# Patient Record
Sex: Female | Born: 1985 | Race: Asian | Hispanic: No | Marital: Married | Smoking: Never smoker
Health system: Southern US, Community
[De-identification: ages and names within clinical notes are randomized; demographics above are authoritative.]

## PROBLEM LIST (undated history)

## (undated) DIAGNOSIS — Z87442 Personal history of urinary calculi: Secondary | ICD-10-CM

## (undated) DIAGNOSIS — D219 Benign neoplasm of connective and other soft tissue, unspecified: Secondary | ICD-10-CM

## (undated) DIAGNOSIS — O139 Gestational [pregnancy-induced] hypertension without significant proteinuria, unspecified trimester: Secondary | ICD-10-CM

## (undated) DIAGNOSIS — H18603 Keratoconus, unspecified, bilateral: Secondary | ICD-10-CM

## (undated) DIAGNOSIS — F419 Anxiety disorder, unspecified: Secondary | ICD-10-CM

## (undated) DIAGNOSIS — O1490 Unspecified pre-eclampsia, unspecified trimester: Secondary | ICD-10-CM

## (undated) DIAGNOSIS — E039 Hypothyroidism, unspecified: Secondary | ICD-10-CM

## (undated) DIAGNOSIS — K429 Umbilical hernia without obstruction or gangrene: Secondary | ICD-10-CM

## (undated) HISTORY — PX: OTHER SURGICAL HISTORY: SHX169

---

## 2010-06-21 HISTORY — PX: THYROIDECTOMY, PARTIAL: SHX18

## 2017-06-21 HISTORY — PX: EYE SURGERY: SHX253

## 2017-07-26 DIAGNOSIS — H52203 Unspecified astigmatism, bilateral: Secondary | ICD-10-CM | POA: Insufficient documentation

## 2017-07-26 DIAGNOSIS — H1013 Acute atopic conjunctivitis, bilateral: Secondary | ICD-10-CM | POA: Insufficient documentation

## 2017-07-26 DIAGNOSIS — H18623 Keratoconus, unstable, bilateral: Secondary | ICD-10-CM | POA: Insufficient documentation

## 2017-07-26 DIAGNOSIS — H5213 Myopia, bilateral: Secondary | ICD-10-CM | POA: Insufficient documentation

## 2017-10-13 DIAGNOSIS — K580 Irritable bowel syndrome with diarrhea: Secondary | ICD-10-CM | POA: Insufficient documentation

## 2017-10-13 DIAGNOSIS — E063 Autoimmune thyroiditis: Secondary | ICD-10-CM | POA: Insufficient documentation

## 2019-08-03 ENCOUNTER — Encounter: Payer: Self-pay | Admitting: Podiatry

## 2019-08-03 ENCOUNTER — Ambulatory Visit: Payer: 59 | Admitting: Podiatry

## 2019-08-03 ENCOUNTER — Other Ambulatory Visit: Payer: Self-pay

## 2019-08-03 VITALS — Temp 96.9°F

## 2019-08-03 DIAGNOSIS — B070 Plantar wart: Secondary | ICD-10-CM | POA: Diagnosis not present

## 2019-08-03 NOTE — Progress Notes (Signed)
Subjective:   Patient ID: Amber Kelley, female   DOB: 34 y.o.   MRN: [id number]   HPI Patient presents stating she is having a lot of pain underneath her right foot and states it is been bothering her more over time.  Patient states that it was treated about 8 years ago and seemed to go away for.  And has come back and patient does not smoke likes to be active   Review of Systems  All other systems reviewed and are negative.       Objective:  Physical Exam Vitals and nursing note reviewed.  Constitutional:      Appearance: She is well-developed.  Pulmonary:     Effort: Pulmonary effort is normal.  Musculoskeletal:        General: Normal range of motion.  Skin:    General: Skin is warm.  Neurological:     Mental Status: She is alert.     Neurovascular status intact muscle strength found to be adequate range of motion within normal limits with patient noted to have keratotic lesion subsecond metatarsal right that shows pinpoint bleeding upon debridement and also does have some waxy core appearance to it.  Patient has good digital perfusion well oriented x3     Assessment:  Probability for keratotic lesion which may be verruca plantaris or possible porokeratotic type lesion      Plan:  H&P condition reviewed today I did sharp sterile debridement of the area and I applied chemical agent to create immune response with sterile dressing and advised on home therapy.  As symptoms come back she will let us know and hopefully this will take care of it for significant period of time

## 2019-08-03 NOTE — Progress Notes (Signed)
   Subjective:    Patient ID: Amber Kelley, female    DOB: 09-Jan-1986, 34 y.o.   MRN: [id number]  HPI    Review of Systems  All other systems reviewed and are negative.      Objective:   Physical Exam        Assessment & Plan:

## 2019-11-06 ENCOUNTER — Other Ambulatory Visit: Payer: Self-pay | Admitting: Internal Medicine

## 2019-11-06 DIAGNOSIS — E890 Postprocedural hypothyroidism: Secondary | ICD-10-CM

## 2019-11-06 DIAGNOSIS — E063 Autoimmune thyroiditis: Secondary | ICD-10-CM

## 2019-11-06 DIAGNOSIS — Z8639 Personal history of other endocrine, nutritional and metabolic disease: Secondary | ICD-10-CM

## 2019-11-15 ENCOUNTER — Ambulatory Visit
Admission: RE | Admit: 2019-11-15 | Discharge: 2019-11-15 | Disposition: A | Discharge status: Home / Self Care | Payer: 59 | Source: Ambulatory Visit | Attending: Internal Medicine | Admitting: Internal Medicine

## 2019-11-15 DIAGNOSIS — Z8639 Personal history of other endocrine, nutritional and metabolic disease: Secondary | ICD-10-CM

## 2019-11-15 DIAGNOSIS — E890 Postprocedural hypothyroidism: Secondary | ICD-10-CM

## 2019-11-15 DIAGNOSIS — E063 Autoimmune thyroiditis: Secondary | ICD-10-CM

## 2020-01-17 ENCOUNTER — Other Ambulatory Visit: Payer: Self-pay | Admitting: Family Medicine

## 2020-01-17 DIAGNOSIS — R1909 Other intra-abdominal and pelvic swelling, mass and lump: Secondary | ICD-10-CM

## 2020-01-18 ENCOUNTER — Ambulatory Visit
Admission: RE | Admit: 2020-01-18 | Discharge: 2020-01-18 | Disposition: A | Discharge status: Home / Self Care | Payer: 59 | Source: Ambulatory Visit | Attending: Family Medicine | Admitting: Family Medicine

## 2020-01-18 ENCOUNTER — Other Ambulatory Visit: Payer: Self-pay

## 2020-01-18 DIAGNOSIS — R1909 Other intra-abdominal and pelvic swelling, mass and lump: Secondary | ICD-10-CM

## 2020-01-18 MED ORDER — IOPAMIDOL (ISOVUE-300) INJECTION 61%
100.0000 mL | Freq: Once | INTRAVENOUS | Status: AC | PRN
  Administered 2020-01-18: 100 mL via INTRAVENOUS

## 2020-01-21 ENCOUNTER — Other Ambulatory Visit: Payer: 59

## 2020-04-24 NOTE — H&P (Signed)
Amber Kelley is an 34 y.o. G1P0010 who is admitted for abdominal myomectomy for enlarged, symptomatic fibroid uterus.  Her periods are monthly, normal in flow, and not heavy.  She has symptoms of pelvic pressure and abdominal bloating.  She has three adopted children.  Has a long standing history of infertility and has accepted the reality that she may not ever have biologic children.  She is, however, experiencing distress regarding her abdominal distention and appearing pregnant when she is not.  Work-up: CT A/P (01/18/20): Markedly enlarged uterus 16.7 x 8.3 x 12.6cm containing multiple masses consistent with uterine leiomyomata.  Largest of these measure 6.9cm lower left uterus and 11.0cm superior left uterus.  Unremarkable ovaries. Abdominal US (03/04/20):  Uterus ~21.05 x 8.82 x 11.68cm.   Fibroid 1:  11cm  Fibroid 2 4.55cm  Fibroid 3:  6.08cm  Fibroid 4:  6.41cm  Fibroid 5:  6.93cm.   Anteverted uterus with multiple uterine fibroids - fundal 11 x 8.8 x 7.2cm, anterior 4.6 x 4.5 x 3.8cm, fundal right 6.1 x 6.0 x 5.6cm, posterior 6.4 x 6.1 x 5.2cm, posterior left 6.9 x 6.6 x 6.6cm.   Other smaller fibroids seen throughout uterus.  Uterus measurement approximate - difficult to clearly measure uterus in its entirety due to size.  Endometrium thickened and distorted due to fibroids.  R ovary with simple cyst 3.2 x 3.1 x 2.7cm, avascular.  Left ovary not visualized.  No adnexal masses seen.  Patient Active Problem List   Diagnosis Date Noted  . Fibroid uterus 05/02/2020  . Hypothyroidism due to Hashimoto's thyroiditis 10/13/2017  . Irritable bowel syndrome with diarrhea 10/13/2017  . Allergic conjunctivitis of both eyes 07/26/2017  . Myopia of both eyes with astigmatism 07/26/2017  . 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    Past Medical History:  Diagnosis Date  . History of kidney stones    per patient was told has one kidney stone  . Hypothyroidism   . Umbilical hernia    per patient, was told has a small umbilical hernia     Past Surgical History:  Procedure Laterality Date  . EYE SURGERY Bilateral 2019   per patient "CORNEAL CROSS-LINKING"    No family history on file.  Social History:  reports that she has never smoked. She has never used smokeless tobacco. She reports current alcohol use. No history on file for drug use.  ALLERGIES/MEDS:  Allergies: No Known Allergies  No medications prior to admission.     Review of Systems  Constitutional: Negative.   HENT: Negative.   Eyes: Negative.   Respiratory: Negative.   Cardiovascular: Negative.   Gastrointestinal: Negative.   Genitourinary: Negative.   Musculoskeletal: Negative.   Skin: Negative.   Neurological: Negative.  Negative for speech change.  Endo/Heme/Allergies: Negative.   Psychiatric/Behavioral: Negative.    Physical Exam: Last menstrual period 04/14/2020. Gen:  NAD Cardio:  RRR Pulm:  CTAB, no wheezes/rales/rhonchi Abd:  Soft, distended, uterus palpable 3-5TI above umbilicus, non-tender throughout, no rebound/guarding Ext:  No bilateral LE edema  No results found for this or any previous visit (from the past 24 hour(s)).  No results found.  ASSESSMENT/PLAN: Amber Kelley is a 34 y.o. G1P0010 who is admitted for abdominal myomectomy for enlarged, symptomatic fibroid uterus.  - Admit to [facility] labs (CBC, T&S, COVID screen) - Diet:  NPO - IVF:  Per anethesia - VTE Prophylaxis:  SCDs - Antibiotics:  Ancef  2g on call to OR - Anticipate discharge home POD#1-2  Consents: I have explained to the patient that this surgery is performed to remove fibroids from the uterus through an incision in the abdomen.  I discussed the risks and benefits of the surgery, including, but not limited to bleeding, including the need for a blood transfusion and/or hysterectomy, need for cesarean section for all future deliveries, rupture of the uterus during a future pregnancy that could cause a preterm delivery and/or requiring hysterectomy, recurrence of fibroids, infection, damage to surrounding organs and tissues, damage to bladder, damage to ureters, causing kidney damage, and requiring additional procedures, damage to bowels, resulting in further surgery, postoperative pain, short-term and long-term, scarring on the abdominal wall and intra-abdominally, need for further surgery, development of an incisional hernia, deep vein thrombosis and/or pulmonary embolism, wound infection and/or separation, painful intercourse, urinary leakage, ovarian failure, resulting in menopausal symptoms requiring treatment, fistula formation, complications the course of which cannot be predicted or prevented, and death. Patient was consented for blood products.  The patient is aware that bleeding may result in the need for a blood transfusion which includes risk of transmission of HIV (1:2 million), Hepatitis C (1:2 million), and Hepatitis B (1:200 thousand) and transfusion reaction.  Patient voiced understanding of the above risks as well as understanding of indications for blood transfusion.   Drema Dallas, DO [phone] (office)

## 2020-05-01 ENCOUNTER — Encounter (HOSPITAL_COMMUNITY)
Admission: RE | Admit: 2020-05-01 | Discharge: 2020-05-01 | Disposition: A | Discharge status: Home / Self Care | Payer: 59 | Source: Ambulatory Visit | Attending: Obstetrics and Gynecology | Admitting: Obstetrics and Gynecology

## 2020-05-01 ENCOUNTER — Encounter (HOSPITAL_COMMUNITY): Payer: Self-pay

## 2020-05-01 ENCOUNTER — Other Ambulatory Visit: Payer: Self-pay

## 2020-05-01 DIAGNOSIS — Z01812 Encounter for preprocedural laboratory examination: Secondary | ICD-10-CM | POA: Diagnosis present

## 2020-05-01 HISTORY — DX: Hypothyroidism, unspecified: E03.9

## 2020-05-01 HISTORY — DX: Personal history of urinary calculi: Z87.442

## 2020-05-01 HISTORY — DX: Umbilical hernia without obstruction or gangrene: K42.9

## 2020-05-01 LAB — CBC WITH DIFFERENTIAL/PLATELET
Abs Immature Granulocytes: 0.03 10*3/uL (ref 0.00–0.07)
Basophils Absolute: 0 10*3/uL (ref 0.0–0.1)
Basophils Relative: 1 %
Eosinophils Absolute: 0.1 10*3/uL (ref 0.0–0.5)
Eosinophils Relative: 2 %
HCT: 40 % (ref 36.0–46.0)
Hemoglobin: 12.5 g/dL (ref 12.0–15.0)
Immature Granulocytes: 1 %
Lymphocytes Relative: 27 %
Lymphs Abs: 1.8 10*3/uL (ref 0.7–4.0)
MCH: 25.9 pg — ABNORMAL LOW (ref 26.0–34.0)
MCHC: 31.3 g/dL (ref 30.0–36.0)
MCV: 83 fL (ref 80.0–100.0)
Monocytes Absolute: 0.3 10*3/uL (ref 0.1–1.0)
Monocytes Relative: 5 %
Neutro Abs: 4.2 10*3/uL (ref 1.7–7.7)
Neutrophils Relative %: 64 %
Platelets: 261 10*3/uL (ref 150–400)
RBC: 4.82 MIL/uL (ref 3.87–5.11)
RDW: 13.2 % (ref 11.5–15.5)
WBC: 6.5 10*3/uL (ref 4.0–10.5)
nRBC: 0 % (ref 0.0–0.2)

## 2020-05-01 LAB — TYPE AND SCREEN
ABO/RH(D): O POS
Antibody Screen: NEGATIVE

## 2020-05-01 NOTE — Progress Notes (Signed)
Your procedure is scheduled on Wednesday, November 17th.  Report to [facility] Main Entrance "A" at 6:30 A.M., and check in at the Admitting office.  Call this number if you have problems the morning of surgery:  [phone]  Call [phone] if you have any questions prior to your surgery date Monday-Friday 8am-4pm   Remember:  Do not eat after midnight the night before your surgery  You may drink clear liquids until 5:30 A.M. the morning of your surgery.   Clear liquids allowed are: Water, Non-Citrus Juices (without pulp), Carbonated Beverages, Clear Tea, Black Coffee Only, and Gatorade    Take these medicines the morning of surgery with A SIP OF WATER  levothyroxine (SYNTHROID)   If needed: acetaminophen (TYLENOL)   As of today, STOP taking any Aspirin (unless otherwise instructed by your surgeon) Aleve, Naproxen, Ibuprofen, Motrin, Advil, Goody's, BC's, all herbal medications, fish oil, and all vitamins.                     Do not wear jewelry, make up, or nail polish            Do not wear lotions, powders, perfumes, or deodorant.            Do not shave 48 hours prior to surgery.            Do not bring valuables to the hospital.            [facility] is not responsible for any belongings or valuables.  Do NOT Smoke (Tobacco/Vaping) or drink Alcohol 24 hours prior to your procedure If you use a CPAP at night, you may bring all equipment for your overnight stay.   Contacts, glasses, dentures or bridgework may not be worn into surgery.      For patients admitted to the hospital, discharge time will be determined by your treatment team.   Patients discharged the day of surgery will not be allowed to drive home, and someone needs to stay with them for 24 hours.  Special instructions:   Amber Kelley- Preparing For Surgery  Before surgery, you can play an important role. Because skin is not sterile, your skin needs to be as free of germs as possible. You can reduce the number  of germs on your skin by washing with CHG (chlorahexidine gluconate) Soap before surgery.  CHG is an antiseptic cleaner which kills germs and bonds with the skin to continue killing germs even after washing.    Oral Hygiene is also important to reduce your risk of infection.  Remember - BRUSH YOUR TEETH THE MORNING OF SURGERY WITH YOUR REGULAR TOOTHPASTE  Please do not use if you have an allergy to CHG or antibacterial soaps. If your skin becomes reddened/irritated stop using the CHG.  Do not shave (including legs and underarms) for at least 48 hours prior to first CHG shower. It is OK to shave your face.  Please follow these instructions carefully.   1. Shower the NIGHT BEFORE SURGERY and the MORNING OF SURGERY with CHG Soap.   2. If you chose to wash your hair, wash your hair first as usual with your normal shampoo.  3. After you shampoo, rinse your hair and body thoroughly to remove the shampoo.  4. Use CHG as you would any other liquid soap. You can apply CHG directly to the skin and wash gently with a scrungie or a clean washcloth.   5. Apply the CHG Soap to your body  ONLY FROM THE NECK DOWN.  Do not use on open wounds or open sores. Avoid contact with your eyes, ears, mouth and genitals (private parts). Wash Face and genitals (private parts)  with your normal soap.   6. Wash thoroughly, paying special attention to the area where your surgery will be performed.  7. Thoroughly rinse your body with warm water from the neck down.  8. DO NOT shower/wash with your normal soap after using and rinsing off the CHG Soap.  9. Pat yourself dry with a CLEAN TOWEL.  10. Wear CLEAN PAJAMAS to bed the night before surgery  11. Place CLEAN SHEETS on your bed the night of your first shower and DO NOT SLEEP WITH PETS.  Day of Surgery: Wear Clean/Comfortable clothing the morning of surgery Do not apply any deodorants/lotions.   Remember to brush your teeth WITH YOUR REGULAR TOOTHPASTE.   Please  read over the following fact sheets that you were given.

## 2020-05-01 NOTE — Pre-Procedure Instructions (Signed)
Antoninette Lerner  05/01/2020    Your procedure is scheduled on Wednesday, November 17.  Report to [facility], Main Entrance or Entrance "A" at 6:30 AM                 Your surgery or procedure is scheduled to begin at 8:30 AM   Call this number if you have problems the morning of surgery: [phone]  This is the number for the Pre- Surgical Desk.                For any other questions, please call [phone], Monday - Friday 8 AM - 4 PM.   Remember:  Do not eat or drink after midnight.  You may drink clear liquids until 5:30 AM.   Clear liquids allowed are:                     Water, Juice (non-citric and without pulp - diabetics please choose diet or no sugar options), Carbonated beverages - (diabetics please choose diet or no sugar options), Clear Tea, Black Coffee only (no creamer, milk or cream including half and half), Plain Jell-O only (diabetics please choose diet or no sugar options), Gatorade (diabetics please choose diet or no sugar options) and Plain Popsicles only    Take these medicines the morning of surgery with A SIP OF WATER : levothyroxine (SYNTHROID)  Take if needed: acetaminophen (TYLENOL)  1 Week prior to surgery STOP taking Aspirin, Aspirin Products (Goody Powder, Excedrin Migraine), Ibuprofen (Advil), Naproxen (Aleve), Vitamins and Herbal Products (ie Fish Oil).    Special instructions:    - Preparing For Surgery  Before surgery, you can play an important role. Because skin is not sterile, your skin needs to be as free of germs as possible. You can reduce the number of germs on your skin by washing with CHG (chlorahexidine gluconate) Soap before surgery.  CHG is an antiseptic cleaner which kills germs and bonds with the skin to continue killing germs even after washing.    Oral Hygiene is also important to reduce your risk of infection.  Remember - BRUSH YOUR TEETH THE MORNING OF SURGERY WITH YOUR REGULAR TOOTHPASTE  Please do  not use if you have an allergy to CHG or antibacterial soaps. If your skin becomes reddened/irritated stop using the CHG.  Do not shave (including legs and underarms) for at least 48 hours prior to first CHG shower. It is OK to shave your face.  Please follow these instructions carefully.   1. Shower the NIGHT BEFORE SURGERY and the MORNING OF SURGERY with CHG.   2. If you chose to wash your hair, wash your hair first as usual with your normal shampoo.  3. After you shampoo, wash your face and private area with the soap you use at home, then rinse your hair and body thoroughly to remove the shampoo and soap.  4. Use CHG as you would any other liquid soap. You can apply CHG directly to the skin and wash gently with a scrungie or a clean washcloth.   5. Apply the CHG Soap to your body ONLY FROM THE NECK DOWN.  Do not use on open wounds or open sores. Avoid contact with your eyes, ears, mouth and genitals (private parts).   6. Wash thoroughly, paying special attention to the area where your surgery will be performed.  7. Thoroughly rinse your body with warm water from the neck down.  8. DO NOT  shower/wash with your normal soap after using and rinsing off the CHG Soap.  9. Pat yourself dry with a CLEAN TOWEL.  10. Wear CLEAN PAJAMAS to bed the night before surgery, wear comfortable clothes the morning of surgery  11. Place CLEAN SHEETS on your bed the night of your first shower and DO NOT SLEEP WITH PETS.  Day of Surgery: Shower as instructed above. Do not apply any deodorants/lotions, powders or colognes.  Please wear clean clothes to the hospital/surgery center.   Remember to brush your teeth WITH YOUR REGULAR TOOTHPASTE.  Do not wear jewelry, make-up or nail polish.  Do not shave 48 hours prior to surgery.  Men may shave face and neck.  Do not bring valuables to the hospital.  [facility] is not responsible for any belongings or valuables.  Contacts, dentures or bridgework may not  be worn into surgery.  Leave your suitcase in the car.  After surgery it may be brought to your room.  For patients admitted to the hospital, discharge time will be determined by your treatment team.  Patients discharged the day of surgery will not be allowed to drive home.   Please read over the fact sheets that you were given.

## 2020-05-01 NOTE — Progress Notes (Signed)
PCP - [facility] Cardiologist - denies  PPM/ICD - denies   Chest x-ray - N/A EKG - N/A Stress Test - denies  ECHO - denies Cardiac Cath - denies  Sleep Study - N/A CPAP - N/A  DM: denies  Blood Thinner Instructions: N/A Aspirin Instructions: N/A  ERAS Protcol - PRE-SURGERY Ensure or G2- None ordered  COVID TEST- Scheduled for 05/05/2020. Patient verbalized understanding of self-quarantine instructions, appointment time and place.  Anesthesia review: No  Patient denies shortness of breath, fever, cough and chest pain at PAT appointment  All instructions explained to the patient, with a verbal understanding of the material. Patient agrees to go over the instructions while at home for a better understanding. Patient also instructed to self quarantine after being tested for COVID-19. The opportunity to ask questions was provided.

## 2020-05-02 ENCOUNTER — Encounter (HOSPITAL_COMMUNITY): Payer: Self-pay | Admitting: Obstetrics and Gynecology

## 2020-05-02 DIAGNOSIS — D259 Leiomyoma of uterus, unspecified: Secondary | ICD-10-CM

## 2020-05-05 ENCOUNTER — Other Ambulatory Visit (HOSPITAL_COMMUNITY)
Admission: RE | Admit: 2020-05-05 | Discharge: 2020-05-05 | Disposition: A | Discharge status: Home / Self Care | Payer: 59 | Source: Ambulatory Visit | Attending: Obstetrics and Gynecology | Admitting: Obstetrics and Gynecology

## 2020-05-05 DIAGNOSIS — Z20822 Contact with and (suspected) exposure to covid-19: Secondary | ICD-10-CM | POA: Insufficient documentation

## 2020-05-05 DIAGNOSIS — Z01812 Encounter for preprocedural laboratory examination: Secondary | ICD-10-CM | POA: Insufficient documentation

## 2020-05-05 LAB — SARS CORONAVIRUS 2 (TAT 6-24 HRS): SARS Coronavirus 2: NEGATIVE

## 2020-05-06 NOTE — Anesthesia Preprocedure Evaluation (Addendum)
Anesthesia Evaluation  Patient identified by MRN, date of birth, ID band Patient awake    Reviewed: Allergy & Precautions, NPO status , Patient's Chart, lab work & pertinent test results  History of Anesthesia Complications Negative for: history of anesthetic complications  Airway Mallampati: I  TM Distance: >3 FB Neck ROM: Full    Dental  (+) Dental Advisory Given, Teeth Intact   Pulmonary neg pulmonary ROS,    Pulmonary exam normal        Cardiovascular negative cardio ROS Normal cardiovascular exam     Neuro/Psych negative neurological ROS  negative psych ROS   GI/Hepatic negative GI ROS, Neg liver ROS,   Endo/Other  Hypothyroidism   Renal/GU negative Renal ROS     Musculoskeletal negative musculoskeletal ROS (+)   Abdominal   Peds  Hematology negative hematology ROS (+)   Anesthesia Other Findings Covid test negative   Reproductive/Obstetrics                            Anesthesia Physical Anesthesia Plan  ASA: II  Anesthesia Plan: General   Post-op Pain Management:    Induction: Intravenous  PONV Risk Score and Plan: 4 or greater and Treatment may vary due to age or medical condition, Ondansetron, Scopolamine patch - Pre-op, Midazolam and Dexamethasone  Airway Management Planned: Oral ETT  Additional Equipment: None  Intra-op Plan:   Post-operative Plan: Extubation in OR  Informed Consent: I have reviewed the patients History and Physical, chart, labs and discussed the procedure including the risks, benefits and alternatives for the proposed anesthesia with the patient or authorized representative who has indicated his/her understanding and acceptance.     Dental advisory given  Plan Discussed with: CRNA and Anesthesiologist  Anesthesia Plan Comments:        Anesthesia Quick Evaluation

## 2020-05-07 ENCOUNTER — Inpatient Hospital Stay (HOSPITAL_COMMUNITY)
Admission: RE | Admit: 2020-05-07 | Discharge: 2020-05-08 | DRG: 743 | Disposition: A | Discharge status: Home / Self Care | Payer: 59 | Attending: Obstetrics and Gynecology | Admitting: Obstetrics and Gynecology

## 2020-05-07 ENCOUNTER — Inpatient Hospital Stay (HOSPITAL_COMMUNITY): Payer: 59 | Admitting: Anesthesiology

## 2020-05-07 ENCOUNTER — Encounter (HOSPITAL_COMMUNITY): Payer: Self-pay | Admitting: Obstetrics and Gynecology

## 2020-05-07 ENCOUNTER — Encounter (HOSPITAL_COMMUNITY)
Admission: RE | Disposition: A | Discharge status: Home / Self Care | Payer: Self-pay | Source: Home / Self Care | Attending: Obstetrics and Gynecology

## 2020-05-07 ENCOUNTER — Other Ambulatory Visit: Payer: Self-pay

## 2020-05-07 DIAGNOSIS — N838 Other noninflammatory disorders of ovary, fallopian tube and broad ligament: Secondary | ICD-10-CM | POA: Diagnosis not present

## 2020-05-07 DIAGNOSIS — E063 Autoimmune thyroiditis: Secondary | ICD-10-CM | POA: Diagnosis not present

## 2020-05-07 DIAGNOSIS — Z20822 Contact with and (suspected) exposure to covid-19: Secondary | ICD-10-CM | POA: Diagnosis not present

## 2020-05-07 DIAGNOSIS — D251 Intramural leiomyoma of uterus: Secondary | ICD-10-CM | POA: Diagnosis present

## 2020-05-07 DIAGNOSIS — D259 Leiomyoma of uterus, unspecified: Secondary | ICD-10-CM

## 2020-05-07 HISTORY — PX: MYOMECTOMY: SHX85

## 2020-05-07 LAB — ABO/RH: ABO/RH(D): O POS

## 2020-05-07 LAB — POCT PREGNANCY, URINE: Preg Test, Ur: NEGATIVE

## 2020-05-07 SURGERY — MYOMECTOMY, ABDOMINAL APPROACH
Anesthesia: General

## 2020-05-07 MED ORDER — ROCURONIUM BROMIDE 10 MG/ML (PF) SYRINGE
PREFILLED_SYRINGE | INTRAVENOUS | Status: AC
  Filled 2020-05-07: qty 10

## 2020-05-07 MED ORDER — FENTANYL CITRATE (PF) 100 MCG/2ML IJ SOLN: 25.0000 ug | INTRAMUSCULAR | Status: DC | PRN

## 2020-05-07 MED ORDER — PROMETHAZINE HCL 25 MG/ML IJ SOLN: 6.2500 mg | INTRAMUSCULAR | Status: DC | PRN

## 2020-05-07 MED ORDER — MIDAZOLAM HCL 2 MG/2ML IJ SOLN
INTRAMUSCULAR | Status: AC
  Filled 2020-05-07: qty 2

## 2020-05-07 MED ORDER — POVIDONE-IODINE 10 % EX SWAB
2.0000 "application " | Freq: Once | CUTANEOUS | Status: AC
  Administered 2020-05-07: 2 via TOPICAL

## 2020-05-07 MED ORDER — ROCURONIUM BROMIDE 10 MG/ML (PF) SYRINGE
PREFILLED_SYRINGE | INTRAVENOUS | Status: DC | PRN
  Administered 2020-05-07: 50 mg via INTRAVENOUS
  Administered 2020-05-07: 10 mg via INTRAVENOUS
  Administered 2020-05-07 (×2): 20 mg via INTRAVENOUS
  Administered 2020-05-07: 10 mg via INTRAVENOUS

## 2020-05-07 MED ORDER — LIDOCAINE 2% (20 MG/ML) 5 ML SYRINGE
INTRAMUSCULAR | Status: DC | PRN
  Administered 2020-05-07: 60 mg via INTRAVENOUS

## 2020-05-07 MED ORDER — MIDAZOLAM HCL 2 MG/2ML IJ SOLN
INTRAMUSCULAR | Status: DC | PRN
  Administered 2020-05-07: 2 mg via INTRAVENOUS

## 2020-05-07 MED ORDER — SCOPOLAMINE 1 MG/3DAYS TD PT72
MEDICATED_PATCH | TRANSDERMAL | Status: AC
  Filled 2020-05-07: qty 1

## 2020-05-07 MED ORDER — KETOROLAC TROMETHAMINE 30 MG/ML IJ SOLN
INTRAMUSCULAR | Status: AC
  Filled 2020-05-07: qty 1

## 2020-05-07 MED ORDER — DEXAMETHASONE SODIUM PHOSPHATE 10 MG/ML IJ SOLN
INTRAMUSCULAR | Status: AC
  Filled 2020-05-07: qty 1

## 2020-05-07 MED ORDER — KETOROLAC TROMETHAMINE 30 MG/ML IJ SOLN
INTRAMUSCULAR | Status: DC | PRN
  Administered 2020-05-07: 30 mg via INTRAVENOUS

## 2020-05-07 MED ORDER — 0.9 % SODIUM CHLORIDE (POUR BTL) OPTIME
TOPICAL | Status: DC | PRN
  Administered 2020-05-07: 1000 mL

## 2020-05-07 MED ORDER — LIDOCAINE 2% (20 MG/ML) 5 ML SYRINGE
INTRAMUSCULAR | Status: AC
  Filled 2020-05-07: qty 5

## 2020-05-07 MED ORDER — ALBUMIN HUMAN 5 % IV SOLN: INTRAVENOUS | Status: DC | PRN

## 2020-05-07 MED ORDER — FENTANYL CITRATE (PF) 250 MCG/5ML IJ SOLN
INTRAMUSCULAR | Status: AC
  Filled 2020-05-07: qty 5

## 2020-05-07 MED ORDER — VASOPRESSIN 20 UNIT/ML IV SOLN
INTRAVENOUS | Status: AC
  Filled 2020-05-07: qty 1

## 2020-05-07 MED ORDER — SIMETHICONE 80 MG PO CHEW: 80.0000 mg | CHEWABLE_TABLET | Freq: Four times a day (QID) | ORAL | Status: DC | PRN

## 2020-05-07 MED ORDER — PANTOPRAZOLE SODIUM 40 MG PO TBEC
40.0000 mg | DELAYED_RELEASE_TABLET | Freq: Every day | ORAL | Status: DC
  Administered 2020-05-07: 40 mg via ORAL
  Filled 2020-05-07: qty 1

## 2020-05-07 MED ORDER — SODIUM CHLORIDE (PF) 0.9 % IJ SOLN
INTRAMUSCULAR | Status: AC
  Filled 2020-05-07: qty 100

## 2020-05-07 MED ORDER — SUGAMMADEX SODIUM 200 MG/2ML IV SOLN
INTRAVENOUS | Status: DC | PRN
  Administered 2020-05-07: 200 mg via INTRAVENOUS

## 2020-05-07 MED ORDER — CEFAZOLIN SODIUM-DEXTROSE 2-4 GM/100ML-% IV SOLN
2.0000 g | INTRAVENOUS | Status: AC
  Administered 2020-05-07: 2 g via INTRAVENOUS

## 2020-05-07 MED ORDER — FENTANYL CITRATE (PF) 250 MCG/5ML IJ SOLN
INTRAMUSCULAR | Status: DC | PRN
  Administered 2020-05-07: 50 ug via INTRAVENOUS
  Administered 2020-05-07: 25 ug via INTRAVENOUS
  Administered 2020-05-07: 100 ug via INTRAVENOUS
  Administered 2020-05-07: 25 ug via INTRAVENOUS
  Administered 2020-05-07: 50 ug via INTRAVENOUS

## 2020-05-07 MED ORDER — PROPOFOL 10 MG/ML IV BOLUS
INTRAVENOUS | Status: AC
  Filled 2020-05-07: qty 20

## 2020-05-07 MED ORDER — OXYCODONE HCL 5 MG/5ML PO SOLN: 5.0000 mg | Freq: Once | ORAL | Status: DC | PRN

## 2020-05-07 MED ORDER — DOCUSATE SODIUM 100 MG PO CAPS
100.0000 mg | ORAL_CAPSULE | Freq: Two times a day (BID) | ORAL | Status: DC
  Administered 2020-05-07 – 2020-05-08 (×3): 100 mg via ORAL
  Filled 2020-05-07 (×3): qty 1

## 2020-05-07 MED ORDER — PROPOFOL 10 MG/ML IV BOLUS
INTRAVENOUS | Status: DC | PRN
  Administered 2020-05-07: 200 mg via INTRAVENOUS
  Administered 2020-05-07 (×2): 50 mg via INTRAVENOUS

## 2020-05-07 MED ORDER — ONDANSETRON HCL 4 MG/2ML IJ SOLN: 4.0000 mg | Freq: Four times a day (QID) | INTRAMUSCULAR | Status: DC | PRN

## 2020-05-07 MED ORDER — CHLORHEXIDINE GLUCONATE 0.12 % MT SOLN
OROMUCOSAL | Status: AC
  Administered 2020-05-07: 15 mL
  Filled 2020-05-07: qty 15

## 2020-05-07 MED ORDER — CEFAZOLIN SODIUM-DEXTROSE 2-4 GM/100ML-% IV SOLN
INTRAVENOUS | Status: AC
  Filled 2020-05-07: qty 100

## 2020-05-07 MED ORDER — MORPHINE SULFATE (PF) 2 MG/ML IV SOLN
1.0000 mg | INTRAVENOUS | Status: DC | PRN
  Administered 2020-05-07 – 2020-05-08 (×3): 1 mg via INTRAVENOUS
  Filled 2020-05-07 (×3): qty 1

## 2020-05-07 MED ORDER — HYDROCODONE-ACETAMINOPHEN 5-325 MG PO TABS
1.0000 | ORAL_TABLET | ORAL | Status: DC | PRN
  Filled 2020-05-07: qty 2

## 2020-05-07 MED ORDER — PHENYLEPHRINE 40 MCG/ML (10ML) SYRINGE FOR IV PUSH (FOR BLOOD PRESSURE SUPPORT)
PREFILLED_SYRINGE | INTRAVENOUS | Status: AC
  Filled 2020-05-07: qty 10

## 2020-05-07 MED ORDER — SCOPOLAMINE 1 MG/3DAYS TD PT72
MEDICATED_PATCH | TRANSDERMAL | Status: DC | PRN
  Administered 2020-05-07: 1 via TRANSDERMAL

## 2020-05-07 MED ORDER — IBUPROFEN 800 MG PO TABS
800.0000 mg | ORAL_TABLET | Freq: Three times a day (TID) | ORAL | Status: DC
  Administered 2020-05-07 – 2020-05-08 (×4): 800 mg via ORAL
  Filled 2020-05-07 (×4): qty 1

## 2020-05-07 MED ORDER — OXYCODONE HCL 5 MG PO TABS: 5.0000 mg | ORAL_TABLET | Freq: Once | ORAL | Status: DC | PRN

## 2020-05-07 MED ORDER — ONDANSETRON HCL 4 MG/2ML IJ SOLN
INTRAMUSCULAR | Status: AC
  Filled 2020-05-07: qty 2

## 2020-05-07 MED ORDER — LACTATED RINGERS IV SOLN: INTRAVENOUS | Status: DC

## 2020-05-07 MED ORDER — PANTOPRAZOLE SODIUM 40 MG IV SOLR: 40.0000 mg | Freq: Every day | INTRAVENOUS | Status: DC

## 2020-05-07 MED ORDER — ONDANSETRON HCL 4 MG/2ML IJ SOLN
INTRAMUSCULAR | Status: DC | PRN
  Administered 2020-05-07: 4 mg via INTRAVENOUS

## 2020-05-07 MED ORDER — LEVOTHYROXINE SODIUM 88 MCG PO TABS
88.0000 ug | ORAL_TABLET | Freq: Once | ORAL | Status: DC
  Filled 2020-05-07: qty 1

## 2020-05-07 MED ORDER — ONDANSETRON HCL 4 MG PO TABS: 4.0000 mg | ORAL_TABLET | Freq: Four times a day (QID) | ORAL | Status: DC | PRN

## 2020-05-07 MED ORDER — DEXAMETHASONE SODIUM PHOSPHATE 10 MG/ML IJ SOLN
INTRAMUSCULAR | Status: DC | PRN
  Administered 2020-05-07: 10 mg via INTRAVENOUS

## 2020-05-07 MED ORDER — LACTATED RINGERS IV SOLN: INTRAVENOUS | Status: DC | PRN

## 2020-05-07 MED ORDER — PHENYLEPHRINE 40 MCG/ML (10ML) SYRINGE FOR IV PUSH (FOR BLOOD PRESSURE SUPPORT)
PREFILLED_SYRINGE | INTRAVENOUS | Status: DC | PRN
  Administered 2020-05-07: 80 ug via INTRAVENOUS
  Administered 2020-05-07 (×3): 40 ug via INTRAVENOUS

## 2020-05-07 MED ORDER — VASOPRESSIN 20 UNIT/ML IV SOLN
INTRAVENOUS | Status: DC | PRN
  Administered 2020-05-07: 129 mL via INTRAMUSCULAR

## 2020-05-07 SURGICAL SUPPLY — 47 items
BARRIER ADHS 3X4 INTERCEED (GAUZE/BANDAGES/DRESSINGS) ×6 IMPLANT
BENZOIN TINCTURE PRP APPL 2/3 (GAUZE/BANDAGES/DRESSINGS) ×3 IMPLANT
CANISTER SUCT 3000ML PPV (MISCELLANEOUS) ×3 IMPLANT
CLOSURE STERI-STRIP 1/2X4 (GAUZE/BANDAGES/DRESSINGS) ×1
CLSR STERI-STRIP ANTIMIC 1/2X4 (GAUZE/BANDAGES/DRESSINGS) ×2 IMPLANT
DECANTER SPIKE VIAL GLASS SM (MISCELLANEOUS) ×3 IMPLANT
DRAPE CESAREAN BIRTH W POUCH (DRAPES) ×3 IMPLANT
DRSG OPSITE POSTOP 4X10 (GAUZE/BANDAGES/DRESSINGS) ×3 IMPLANT
GAUZE 4X4 16PLY RFD (DISPOSABLE) IMPLANT
GLOVE BIO SURGEON STRL SZ 6.5 (GLOVE) ×4 IMPLANT
GLOVE BIO SURGEONS STRL SZ 6.5 (GLOVE) ×2
GLOVE BIOGEL PI IND STRL 6.5 (GLOVE) ×1 IMPLANT
GLOVE BIOGEL PI IND STRL 7.0 (GLOVE) ×1 IMPLANT
GLOVE BIOGEL PI INDICATOR 6.5 (GLOVE) ×2
GLOVE BIOGEL PI INDICATOR 7.0 (GLOVE) ×2
GOWN STRL REUS W/ TWL LRG LVL3 (GOWN DISPOSABLE) ×2 IMPLANT
GOWN STRL REUS W/TWL LRG LVL3 (GOWN DISPOSABLE) ×4
HEMOSTAT ARISTA ABSORB 3G PWDR (HEMOSTASIS) IMPLANT
KIT TURNOVER KIT B (KITS) ×3 IMPLANT
NEEDLE HYPO 22GX1.5 SAFETY (NEEDLE) ×3 IMPLANT
NS IRRIG 1000ML POUR BTL (IV SOLUTION) ×3 IMPLANT
PACK ABDOMINAL GYN (CUSTOM PROCEDURE TRAY) ×3 IMPLANT
PAD ARMBOARD 7.5X6 YLW CONV (MISCELLANEOUS) ×3 IMPLANT
PAD OB MATERNITY 4.3X12.25 (PERSONAL CARE ITEMS) ×3 IMPLANT
PENCIL SMOKE EVACUATOR (MISCELLANEOUS) ×3 IMPLANT
RETRACTOR TRL SOFT TISSUE LG (INSTRUMENTS) ×3 IMPLANT
SPECIMEN JAR MEDIUM (MISCELLANEOUS) ×3 IMPLANT
STAPLER VISISTAT 35W (STAPLE) ×3 IMPLANT
SUT MNCRL 0 VIOLET 6X18 (SUTURE) ×1 IMPLANT
SUT MNCRL+ AB 3-0 CT1 36 (SUTURE) ×1 IMPLANT
SUT MON AB 2-0 CT1 27 (SUTURE) ×3 IMPLANT
SUT MON AB 3-0 SH 27 (SUTURE) ×6
SUT MON AB 3-0 SH27 (SUTURE) ×3 IMPLANT
SUT MONOCRYL 0 6X18 (SUTURE) ×2
SUT MONOCRYL AB 3-0 CT1 36IN (SUTURE) ×2
SUT PDS AB 0 CT 36 (SUTURE) ×3 IMPLANT
SUT PDS AB 0 CT1 27 (SUTURE) ×6 IMPLANT
SUT PDS AB 0 CTX 60 (SUTURE) IMPLANT
SUT VIC AB 0 CT1 18XCR BRD8 (SUTURE) ×4 IMPLANT
SUT VIC AB 0 CT1 36 (SUTURE) ×9 IMPLANT
SUT VIC AB 0 CT1 8-18 (SUTURE) ×8
SUT VIC AB 4-0 KS 27 (SUTURE) ×3 IMPLANT
SUT VIC AB 4-0 SH 27 (SUTURE) ×6
SUT VIC AB 4-0 SH 27XANBCTRL (SUTURE) ×3 IMPLANT
SYR CONTROL 10ML LL (SYRINGE) ×3 IMPLANT
TOWEL GREEN STERILE FF (TOWEL DISPOSABLE) ×6 IMPLANT
TRAY FOLEY W/BAG SLVR 14FR (SET/KITS/TRAYS/PACK) ×3 IMPLANT

## 2020-05-07 NOTE — Plan of Care (Signed)

## 2020-05-07 NOTE — Anesthesia Postprocedure Evaluation (Signed)
Anesthesia Post Note  Patient: Idabell Picking  Procedure(s) Performed: ABDOMINAL MYOMECTOMY (N/A )     Patient location during evaluation: PACU Anesthesia Type: General Level of consciousness: awake and alert Pain management: pain level controlled Vital Signs Assessment: post-procedure vital signs reviewed and stable Respiratory status: spontaneous breathing, nonlabored ventilation and respiratory function stable Cardiovascular status: blood pressure returned to baseline and stable Postop Assessment: no apparent nausea or vomiting Anesthetic complications: no   No complications documented.  Last Vitals:  Vitals:   05/07/20 1230 05/07/20 1300  BP: 120/77 124/85  Pulse: 73 72  Resp: 19 15  Temp: 36.8 C 36.6 C  SpO2: 98% 100%    Last Pain:  Vitals:   05/07/20 1337  TempSrc:   PainSc: 0-No pain                 Audry Pili

## 2020-05-07 NOTE — Op Note (Signed)
Pre Op Dx:  Enlarged, symptomatic fibroid uterus Post Op Dx:  Same Procedure:  Abdominal Myomectomy   Surgeon:  Dr. Drema Dallas Assistants:  Dr. Christophe Louis Anesthesia:  General   EBL:  300cc  IVF:  See anesthesia documentation UOP:  See anesthesia documentation   Drains:  Foley catheter Specimen removed:  Uterine leiomyomas sent to pathology Device(s) implanted: None Case Type:  Clean Findings:  Enlarged ~21 week sized uterus with multiple intramural leiomyomas.  A large ~11cm anterior-fundal leiomyoma was present and ~5cm posterior and lateral fibroids were present.  Ten fibroids were removed in total. Normal-appearing bilateral fallopian tubes and ovaries.  Small, sub-centimeter paratubal cysts bilaterally. Complications: None Indications:  34 y.o. G1P0010 with enlarged symptomatic fibroid uterus who desired definitive surgical management. Description of each procedure:  After informed consent was obtained, the patient was taken to the operating room in supine position.  After administration of general anesthesia, she was prepped and draped in the usual sterile fashion.  A foley catheter was placed.  A pre-operative time-out was performed.  The abdomen was entered using a pfannenstiel incision.  Peritoneum was entered sharply and an Chiropodist was placed.  The uterus was brought through the incision and the fibroids were identified.  A dilute solution of Pitressin was used prior to incising the uterus to enucleate the fibroids.  The fibroids were removed with blunt and sharp dissection.  The deep layers of the myometrium were closed using 0-Vicryl in two layers.  The superficial layers were closed using a baseball stitch with 4-0 Monocryl.  The endometrial cavity was not entered.  The pelvis was irrigated and adequate hemostasis was noted.  Interseed was placed on two posterior uterine incisions and across the anterior-fundal incision.  The subfascial spaces and rectus muscles were  inspected and were hemostatic.  The peritoneum was closed using 2-0 Monocryl.  The fascia was closed using 0-PDS.  The subcutaneous tissues were closed using 3-0 Monocryl.  The skin was closed with 4-0 Vicryl.  The incision was dressed with steri strips and a honeycomb dressing.  All counts were correct x 2.  The patient was awakened in stable condition and appeared to have tolerated the procedure well.   Disposition:  PACU  Drema Dallas, DO

## 2020-05-07 NOTE — Progress Notes (Signed)
GYN Progress Note  Subjective: In to see patient post-operatively.  She is doing well.  Pain well-controlled.  Required dose of Morphine x 1.  States rolling to her back causes pain.  Foley in place.  Tolerating regular diet without N/V.  Has not passed flatus.  Denies fevers, chills, chest pain, SOB, or bilateral LE edema.  Objective: BP 124/85 (BP Location: Left Arm)   Pulse 72   Temp 97.9 F (36.6 C) (Oral)   Resp 15   Ht 5\' 3"  (1.6 m)   Wt 64.4 kg   LMP 04/14/2020 (Exact Date)   SpO2 100%   BMI 25.15 kg/m  Gen:  NAD Pulm:  No increased work of breathing Abd:  Soft, non-distended, honeycomb dressing with mild sanginous satruation Ext:  No bilateral LE edema  UOP:  >200cc/hour Foley:  ~300cc clear yellow urine bag  A/P: POD#0 s/p Abdominal Myomectomy for enlarged fibroid uterus.  Routine post-operative care - Pulm:  Encourage IS - GI:  PPI Prophylaxis ordered - Pain:  Controlled (Norco, Motrin, Morphine PRN) - Renal:  AUOP, foley in place - DVT prophyalxis:  SCDs - Labs:  AM CBC ordered  Dispo:  Anticipate D/C POD#1-2  Drema Dallas, DO

## 2020-05-07 NOTE — Interval H&P Note (Signed)
History and Physical Interval Note:  05/07/2020 7:49 AM  Amber Kelley  has presented today for surgery, with the diagnosis of D25.1 Intramural leiomyoma of uterus.  The various methods of treatment have been discussed with the patient and family. After consideration of risks, benefits and other options for treatment, the patient has consented to  Procedure(s): ABDOMINAL MYOMECTOMY (N/A) as a surgical intervention.  The patient's history has been reviewed, patient examined, no change in status, stable for surgery.  I have reviewed the patient's chart and labs.  Questions were answered to the patient's satisfaction.     Drema Dallas

## 2020-05-07 NOTE — Progress Notes (Signed)
Patient arrived to unit from PACU. Patient drowsy but easily aroused, incision site accessed, dressing intact, scan drainage noted. Painful to touch. Vitals WNL, patient placed on clear liquid diet; patient stated she wanted to sleep so meds were moved up for administration per her request.  Warm blankets placed and call bell placed at her side when she is ready for medication administration.

## 2020-05-07 NOTE — Anesthesia Procedure Notes (Signed)

## 2020-05-07 NOTE — Transfer of Care (Signed)
Immediate Anesthesia Transfer of Care Note  Patient: Amber Kelley  Procedure(s) Performed: ABDOMINAL MYOMECTOMY (N/A )  Patient Location: PACU  Anesthesia Type:General  Level of Consciousness: awake, alert  and oriented  Airway & Oxygen Therapy: Patient Spontanous Breathing and Patient connected to face mask oxygen  Post-op Assessment: Report given to RN and Post -op Vital signs reviewed and stable  Post vital signs: Reviewed and stable  Last Vitals:  Vitals Value Taken Time  BP    Temp    Pulse 84 05/07/20 1159  Resp 14 05/07/20 1159  SpO2 100 % 05/07/20 1159  Vitals shown include unvalidated device data.  Last Pain:  Vitals:   05/07/20 0738  TempSrc:   PainSc: 0-No pain      Patients Stated Pain Goal: 3 (72/09/19 8022)  Complications: No complications documented.

## 2020-05-08 ENCOUNTER — Encounter (HOSPITAL_COMMUNITY): Payer: Self-pay | Admitting: Obstetrics and Gynecology

## 2020-05-08 LAB — CBC
HCT: 34 % — ABNORMAL LOW (ref 36.0–46.0)
Hemoglobin: 11 g/dL — ABNORMAL LOW (ref 12.0–15.0)
MCH: 26.1 pg (ref 26.0–34.0)
MCHC: 32.4 g/dL (ref 30.0–36.0)
MCV: 80.6 fL (ref 80.0–100.0)
Platelets: 219 10*3/uL (ref 150–400)
RBC: 4.22 MIL/uL (ref 3.87–5.11)
RDW: 13.1 % (ref 11.5–15.5)
WBC: 12.9 10*3/uL — ABNORMAL HIGH (ref 4.0–10.5)
nRBC: 0 % (ref 0.0–0.2)

## 2020-05-08 LAB — SURGICAL PATHOLOGY

## 2020-05-08 MED ORDER — IBUPROFEN 800 MG PO TABS: 800.0000 mg | ORAL_TABLET | Freq: Three times a day (TID) | ORAL | 0 refills | Status: DC | PRN

## 2020-05-08 MED ORDER — HYDROCODONE-ACETAMINOPHEN 5-325 MG PO TABS
1.0000 | ORAL_TABLET | ORAL | 0 refills | Status: DC | PRN
Start: 2020-05-08 — End: 2022-05-11

## 2020-05-08 MED ORDER — ONDANSETRON 4 MG PO TBDP: 4.0000 mg | ORAL_TABLET | Freq: Three times a day (TID) | ORAL | Status: DC | PRN

## 2020-05-08 NOTE — Progress Notes (Signed)
GYN Progress Note  Subjective: In to assess patient this AM.  She is doing well.  Pain is well-controlled.  Tolerating regular diet without N/V.  Foley removed this AM, has not voided yet.  Has passed flatus.  Has not ambulated yet.  Would like to go home today, if possible.  Denies fevers, chills, chest pain, SOB, or bilateral LE edema.  Objective: BP 115/81 (BP Location: Right Arm)   Pulse 69   Temp 98.6 F (37 C) (Oral)   Resp 18   Ht 5\' 3"  (1.6 m)   Wt 64.4 kg   LMP 04/14/2020 (Exact Date)   SpO2 99%   BMI 25.15 kg/m  Gen:  NAD Cardio:  RRR Pulm:  No increased work of breathing Abd:  Soft, non-distended, honeycomb dressing with mild dried sanginous satruation Ext:  No bilateral LE edema  UOP:  >300cc/hour  CBC    Component Value Date/Time   WBC 12.9 (H) 05/08/2020 0301   RBC 4.22 05/08/2020 0301   HGB 11.0 (L) 05/08/2020 0301   HCT 34.0 (L) 05/08/2020 0301   PLT 219 05/08/2020 0301   MCV 80.6 05/08/2020 0301   MCH 26.1 05/08/2020 0301   MCHC 32.4 05/08/2020 0301   RDW 13.1 05/08/2020 0301   LYMPHSABS 1.8 05/01/2020 1139   MONOABS 0.3 05/01/2020 1139   EOSABS 0.1 05/01/2020 1139   BASOSABS 0.0 05/01/2020 1139    A/P: POD#1 s/p Abdominal Myomectomy for enlarged fibroid uterus.  Routine post-operative care - Pulm:  Encourage IS - GI:  PPI Prophylaxis ordered - Pain:  Controlled (Norco, Motrin) - Renal:  AUOP, foley removed this AM, awaiting PFV - DVT prophyalxis:  SCDs - Labs:  As above  Dispo:  Anticipate D/C today or tomorrow D/C Instructions:  Reviewed in full detail  Drema Dallas, DO

## 2020-05-08 NOTE — Discharge Summary (Signed)
Physician Discharge Summary  Patient ID: Amber Kelley MRN: [id number] DOB/AGE: [birth year] 34 y.o.  Admit date: 05/07/2020 Discharge date: 05/08/2020  Admission Diagnoses:  Enlarged, symptomatic fibroid uterus Procedure:  Abdominal Myomectomy  Discharge Diagnoses:  Active Problems:   Fibroid uterus   Discharged Condition: good  Hospital Course: Patient was admitted on 05/07/2020 for the procedure as documented above.  She had an uneventful post-operative course and was discharged on 05/08/20.  Consults: None  Significant Diagnostic Studies: labs: CBC   Discharge Exam: Blood pressure 115/81, pulse 69, temperature 98.6 F (37 C), temperature source Oral, resp. rate 18, height 5\' 3"  (1.6 m), weight 64.4 kg, last menstrual period 04/14/2020, SpO2 99 %.  Disposition:    Allergies as of 05/08/2020   No Known Allergies     Medication List    TAKE these medications   acetaminophen 500 MG tablet Commonly known as: TYLENOL Take 500 mg by mouth every 6 (six) hours as needed for moderate pain or headache.   HYDROcodone-acetaminophen 5-325 MG tablet Commonly known as: NORCO/VICODIN Take 1-2 tablets by mouth every 4 (four) hours as needed for moderate pain.   ibuprofen 200 MG tablet Commonly known as: ADVIL Take 200-400 mg by mouth every 6 (six) hours as needed for headache or moderate pain. What changed: Another medication with the same name was added. Make sure you understand how and when to take each.   ibuprofen 800 MG tablet Commonly known as: ADVIL Take 1 tablet (800 mg total) by mouth every 8 (eight) hours as needed. What changed: You were already taking a medication with the same name, and this prescription was added. Make sure you understand how and when to take each.   levothyroxine 88 MCG tablet Commonly known as: SYNTHROID Take 88 mcg by mouth daily before breakfast.   phenylephrine 10 MG Tabs tablet Commonly known as: SUDAFED PE Take 10 mg by mouth as  needed (at night time).       Follow-up Information    Drema Dallas, DO Follow up in 2 week(s).   Specialty: Obstetrics and Gynecology Contact information: [address] [place] [address] [phone]               Signed: Drema Dallas 05/08/2020, 6:49 AM

## 2020-05-08 NOTE — Plan of Care (Signed)
Patient ready for discharge. Ambulating in the hall, voiding and pain controlled with tylenol per patient.  Problem: Education: Goal: Knowledge of the prescribed therapeutic regimen will improve Outcome: Adequate for Discharge Goal: Understanding of sexual limitations or changes related to disease process or condition will improve Outcome: Adequate for Discharge Goal: Individualized Educational Video(s) Outcome: Adequate for Discharge   Problem: Self-Concept: Goal: Communication of feelings regarding changes in body function or appearance will improve Outcome: Adequate for Discharge   Problem: Skin Integrity: Goal: Demonstration of wound healing without infection will improve Outcome: Adequate for Discharge   Problem: Education: Goal: Knowledge of General Education information will improve Description: Including pain rating scale, medication(s)/side effects and non-pharmacologic comfort measures Outcome: Adequate for Discharge   Problem: Health Behavior/Discharge Planning: Goal: Ability to manage health-related needs will improve Outcome: Adequate for Discharge   Problem: Clinical Measurements: Goal: Ability to maintain clinical measurements within normal limits will improve Outcome: Adequate for Discharge Goal: Will remain free from infection Outcome: Adequate for Discharge Goal: Diagnostic test results will improve Outcome: Adequate for Discharge Goal: Respiratory complications will improve Outcome: Adequate for Discharge Goal: Cardiovascular complication will be avoided Outcome: Adequate for Discharge   Problem: Activity: Goal: Risk for activity intolerance will decrease Outcome: Adequate for Discharge   Problem: Nutrition: Goal: Adequate nutrition will be maintained Outcome: Adequate for Discharge   Problem: Coping: Goal: Level of anxiety will decrease Outcome: Adequate for Discharge   Problem: Elimination: Goal: Will not experience complications related to  bowel motility Outcome: Adequate for Discharge Goal: Will not experience complications related to urinary retention Outcome: Adequate for Discharge   Problem: Pain Managment: Goal: General experience of comfort will improve Outcome: Adequate for Discharge   Problem: Safety: Goal: Ability to remain free from injury will improve Outcome: Adequate for Discharge   Problem: Skin Integrity: Goal: Risk for impaired skin integrity will decrease Outcome: Adequate for Discharge

## 2020-08-04 DIAGNOSIS — H6983 Other specified disorders of Eustachian tube, bilateral: Secondary | ICD-10-CM | POA: Diagnosis not present

## 2020-08-04 DIAGNOSIS — H608X2 Other otitis externa, left ear: Secondary | ICD-10-CM | POA: Diagnosis not present

## 2020-08-11 DIAGNOSIS — J019 Acute sinusitis, unspecified: Secondary | ICD-10-CM | POA: Diagnosis not present

## 2020-08-11 DIAGNOSIS — E063 Autoimmune thyroiditis: Secondary | ICD-10-CM | POA: Diagnosis not present

## 2020-08-11 DIAGNOSIS — Z0000 Encounter for general adult medical examination without abnormal findings: Secondary | ICD-10-CM | POA: Diagnosis not present

## 2020-08-11 DIAGNOSIS — Z111 Encounter for screening for respiratory tuberculosis: Secondary | ICD-10-CM | POA: Diagnosis not present

## 2021-06-24 ENCOUNTER — Emergency Department (HOSPITAL_BASED_OUTPATIENT_CLINIC_OR_DEPARTMENT_OTHER): Payer: 59

## 2021-06-24 ENCOUNTER — Emergency Department (HOSPITAL_BASED_OUTPATIENT_CLINIC_OR_DEPARTMENT_OTHER)
Admission: EM | Admit: 2021-06-24 | Discharge: 2021-06-24 | Disposition: A | Discharge status: Home / Self Care | Payer: 59 | Attending: Emergency Medicine | Admitting: Emergency Medicine

## 2021-06-24 ENCOUNTER — Encounter (HOSPITAL_BASED_OUTPATIENT_CLINIC_OR_DEPARTMENT_OTHER): Payer: Self-pay

## 2021-06-24 ENCOUNTER — Other Ambulatory Visit: Payer: Self-pay

## 2021-06-24 DIAGNOSIS — E039 Hypothyroidism, unspecified: Secondary | ICD-10-CM | POA: Insufficient documentation

## 2021-06-24 DIAGNOSIS — R0602 Shortness of breath: Secondary | ICD-10-CM

## 2021-06-24 DIAGNOSIS — Z79899 Other long term (current) drug therapy: Secondary | ICD-10-CM | POA: Insufficient documentation

## 2021-06-24 DIAGNOSIS — Z20822 Contact with and (suspected) exposure to covid-19: Secondary | ICD-10-CM | POA: Diagnosis not present

## 2021-06-24 DIAGNOSIS — E876 Hypokalemia: Secondary | ICD-10-CM | POA: Insufficient documentation

## 2021-06-24 DIAGNOSIS — J069 Acute upper respiratory infection, unspecified: Secondary | ICD-10-CM | POA: Insufficient documentation

## 2021-06-24 DIAGNOSIS — B9689 Other specified bacterial agents as the cause of diseases classified elsewhere: Secondary | ICD-10-CM | POA: Diagnosis not present

## 2021-06-24 LAB — COMPREHENSIVE METABOLIC PANEL
ALT: 40 U/L (ref 0–44)
AST: 37 U/L (ref 15–41)
Albumin: 3.5 g/dL (ref 3.5–5.0)
Alkaline Phosphatase: 92 U/L (ref 38–126)
Anion gap: 12 (ref 5–15)
BUN: <5 mg/dL — ABNORMAL LOW (ref 6–20)
CO2: 22 mmol/L (ref 22–32)
Calcium: 8 mg/dL — ABNORMAL LOW (ref 8.9–10.3)
Chloride: 99 mmol/L (ref 98–111)
Creatinine, Ser: 0.53 mg/dL (ref 0.44–1.00)
GFR, Estimated: >60 mL/min (ref >60)
Glucose, Bld: 115 mg/dL — ABNORMAL HIGH (ref 70–99)
Potassium: 2.9 mmol/L — ABNORMAL LOW (ref 3.5–5.1)
Sodium: 133 mmol/L — ABNORMAL LOW (ref 135–145)
Total Bilirubin: 0.5 mg/dL (ref 0.3–1.2)
Total Protein: 8.1 g/dL (ref 6.5–8.1)

## 2021-06-24 LAB — URINALYSIS, ROUTINE W REFLEX MICROSCOPIC
Bilirubin Urine: NEGATIVE
Glucose, UA: NEGATIVE mg/dL
Ketones, ur: NEGATIVE mg/dL
Leukocytes,Ua: NEGATIVE
Nitrite: NEGATIVE
Protein, ur: NEGATIVE mg/dL
Specific Gravity, Urine: 1.01 (ref 1.005–1.030)
pH: 7 (ref 5.0–8.0)

## 2021-06-24 LAB — CBC WITH DIFFERENTIAL/PLATELET
Abs Immature Granulocytes: 0.05 10*3/uL (ref 0.00–0.07)
Basophils Absolute: 0 10*3/uL (ref 0.0–0.1)
Basophils Relative: 0 %
Eosinophils Absolute: 0.1 10*3/uL (ref 0.0–0.5)
Eosinophils Relative: 2 %
HCT: 37.1 % (ref 36.0–46.0)
Hemoglobin: 12.3 g/dL (ref 12.0–15.0)
Immature Granulocytes: 1 %
Lymphocytes Relative: 14 %
Lymphs Abs: 1.1 10*3/uL (ref 0.7–4.0)
MCH: 26.3 pg (ref 26.0–34.0)
MCHC: 33.2 g/dL (ref 30.0–36.0)
MCV: 79.3 fL — ABNORMAL LOW (ref 80.0–100.0)
Monocytes Absolute: 0.4 10*3/uL (ref 0.1–1.0)
Monocytes Relative: 5 %
Neutro Abs: 6.5 10*3/uL (ref 1.7–7.7)
Neutrophils Relative %: 78 %
Platelets: 285 10*3/uL (ref 150–400)
RBC: 4.68 MIL/uL (ref 3.87–5.11)
RDW: 13.4 % (ref 11.5–15.5)
WBC: 8.2 10*3/uL (ref 4.0–10.5)
nRBC: 0 % (ref 0.0–0.2)

## 2021-06-24 LAB — RESP PANEL BY RT-PCR (FLU A&B, COVID) ARPGX2
Influenza A by PCR: NEGATIVE
Influenza B by PCR: NEGATIVE
SARS Coronavirus 2 by RT PCR: NEGATIVE

## 2021-06-24 LAB — URINALYSIS, MICROSCOPIC (REFLEX)

## 2021-06-24 LAB — TROPONIN I (HIGH SENSITIVITY)
Troponin I (High Sensitivity): 3 ng/L (ref <18)
Troponin I (High Sensitivity): 3 ng/L (ref <18)

## 2021-06-24 LAB — LACTIC ACID, PLASMA: Lactic Acid, Venous: 2.1 mmol/L (ref 0.5–1.9)

## 2021-06-24 LAB — GROUP A STREP BY PCR: Group A Strep by PCR: NOT DETECTED

## 2021-06-24 LAB — MAGNESIUM: Magnesium: 1.6 mg/dL — ABNORMAL LOW (ref 1.7–2.4)

## 2021-06-24 MED ORDER — AZITHROMYCIN 500 MG PO TABS: 500.0000 mg | ORAL_TABLET | Freq: Every day | ORAL | 0 refills | Status: AC

## 2021-06-24 MED ORDER — POTASSIUM CHLORIDE 10 MEQ/100ML IV SOLN
10.0000 meq | Freq: Once | INTRAVENOUS | Status: AC
  Administered 2021-06-24: 10 meq via INTRAVENOUS
  Filled 2021-06-24: qty 100

## 2021-06-24 MED ORDER — POTASSIUM CHLORIDE CRYS ER 20 MEQ PO TBCR
40.0000 meq | EXTENDED_RELEASE_TABLET | Freq: Once | ORAL | Status: AC
  Administered 2021-06-24: 40 meq via ORAL
  Filled 2021-06-24: qty 2

## 2021-06-24 MED ORDER — POTASSIUM CHLORIDE CRYS ER 10 MEQ PO TBCR: 10.0000 meq | EXTENDED_RELEASE_TABLET | Freq: Two times a day (BID) | ORAL | 0 refills | Status: DC

## 2021-06-24 MED ORDER — SODIUM CHLORIDE 0.9 % IV SOLN: INTRAVENOUS | Status: DC | PRN

## 2021-06-24 MED ORDER — ACETAMINOPHEN 325 MG PO TABS
650.0000 mg | ORAL_TABLET | Freq: Once | ORAL | Status: AC | PRN
  Administered 2021-06-24: 650 mg via ORAL
  Filled 2021-06-24: qty 2

## 2021-06-24 MED ORDER — LACTATED RINGERS IV BOLUS
1000.0000 mL | Freq: Once | INTRAVENOUS | Status: AC
  Administered 2021-06-24: 1000 mL via INTRAVENOUS

## 2021-06-24 MED ORDER — MAGNESIUM SULFATE 2 GM/50ML IV SOLN
2.0000 g | Freq: Once | INTRAVENOUS | Status: AC
  Administered 2021-06-24: 2 g via INTRAVENOUS
  Filled 2021-06-24: qty 50

## 2021-06-24 NOTE — ED Provider Notes (Signed)
[facility] EMERGENCY DEPARTMENT Provider Note   CSN: [id number] Arrival date & time: 06/24/21  0932     History  Chief Complaint  Patient presents with   Shortness of Breath    Amber Kelley is a 36 y.o. female.  With hypothyroidism status post partial thyroidectomy who presents with 1 week of cough, shortness of breath, sore throat.  She was initially evaluated via telehealth and prescribed Keflex, for which she has been taking for the last 2 days without any improvement in symptoms.  She also complains of difficulty breathing and states that her shortness of breath is constant, worse with lying down.  She denies chest pain with her cough or in general.  She reports body aches and fever, congestion, sinus pain, fullness in her ears.  She has tried DayQuil, NyQuil, ibuprofen, Sudafed with no relief of symptoms.  She does have 3 children who were recently sick but managed symptomatically without complication.  She has received 2 COVID vaccines without booster, no flu vaccine.  She denies any OCP or hormone replacement use, smoking or vaping, recent surgeries, recent travel.  She has no known personal or family history of bleeding or clotting disorders.  She has no known cardiac history in family or self.    Home Medications Prior to Admission medications   Medication Sig Start Date End Date Taking? Authorizing Provider  acetaminophen (TYLENOL) 500 MG tablet Take 500 mg by mouth every 6 (six) hours as needed for moderate pain or headache.    [provider]  HYDROcodone-acetaminophen (NORCO/VICODIN) 5-325 MG tablet Take 1-2 tablets by mouth every 4 (four) hours as needed for moderate pain. 05/08/20   Drema Dallas, DO  ibuprofen (ADVIL) 200 MG tablet Take 200-400 mg by mouth every 6 (six) hours as needed for headache or moderate pain.    [provider]  ibuprofen (ADVIL) 800 MG tablet Take 1 tablet (800 mg total) by mouth every 8 (eight) hours as needed.  05/08/20   Drema Dallas, DO  levothyroxine (SYNTHROID) 88 MCG tablet Take 88 mcg by mouth daily before breakfast.  10/27/17   [provider]  phenylephrine (SUDAFED PE) 10 MG TABS tablet Take 10 mg by mouth as needed (at night time).    [provider]      Allergies    Patient has no known allergies.    Review of Systems   Negative unless otherwise indicated in HPI.  Physical Exam Updated Vital Signs BP 127/90    Pulse 94    Temp (!) 102.5 F (39.2 C) (Oral)    Resp 17    Ht 5\' 2"  (1.575 m)    Wt 65.8 kg    LMP 06/20/2021    SpO2 97%    BMI 26.52 kg/m  Constitutional: Ill-appearing female in no acute distress. Ears: Bilateral TM pearl-colored. Throat: Negative for pharynx or tonsillar exudate, erythema, edema. Cardio: Tachycardic without murmurs, rubs, gallops. Pulm: Tachypneic with left lower lobe crackles. Abdomen: Soft, nontender, nondistended. MSK: Negative for extremity edema or tenderness to palpation.  Distal pulses are normal. Skin: Skin is warm and dry. Neuro: Alert and oriented x3.  No focal deficit noted. Psych: Normal mood and affect.  ED Results / Procedures / Treatments   Labs (all labs ordered are listed, but only abnormal results are displayed) Labs Reviewed  CBC WITH DIFFERENTIAL/PLATELET - Abnormal; Notable for the following components:      Result Value   MCV 79.3 (*)    All other  components within normal limits  COMPREHENSIVE METABOLIC PANEL - Abnormal; Notable for the following components:   Sodium 133 (*)    Potassium 2.9 (*)    Glucose, Bld 115 (*)    BUN <5 (*)    Calcium 8.0 (*)    All other components within normal limits  URINALYSIS, ROUTINE W REFLEX MICROSCOPIC - Abnormal; Notable for the following components:   Hgb urine dipstick MODERATE (*)    All other components within normal limits  LACTIC ACID, PLASMA - Abnormal; Notable for the following components:   Lactic Acid, Venous 2.1 (*)    All other components within  normal limits  MAGNESIUM - Abnormal; Notable for the following components:   Magnesium 1.6 (*)    All other components within normal limits  URINALYSIS, MICROSCOPIC (REFLEX) - Abnormal; Notable for the following components:   Bacteria, UA RARE (*)    All other components within normal limits  GROUP A STREP BY PCR  RESP PANEL BY RT-PCR (FLU A&B, COVID) [id number]  URINE CULTURE  CULTURE, BLOOD (ROUTINE X 2)  CULTURE, BLOOD (ROUTINE X 2)  LACTIC ACID, PLASMA  MONONUCLEOSIS SCREEN  TROPONIN I (HIGH SENSITIVITY)  TROPONIN I (HIGH SENSITIVITY)    EKG EKG Interpretation  Date/Time:  Wednesday June 24 2021 10:38:37 EST Ventricular Rate:  120 PR Interval:  134 QRS Duration: 78 QT Interval:  304 QTC Calculation: 429 R Axis:   90 Text Interpretation: Sinus tachycardia Rightward axis ST & T wave abnormality, consider inferior ischemia ST & T wave abnormality, consider anterolateral ischemia Abnormal ECG No previous ECGs available Confirmed by Gareth Morgan [phone]) on 06/24/2021 11:19:25 AM  Radiology DG Chest Port 1 View  Result Date: 06/24/2021 CLINICAL DATA:  Shortness of breath EXAM: PORTABLE CHEST 1 VIEW COMPARISON:  None. FINDINGS: Heart size and mediastinal contours are within normal limits. No suspicious pulmonary opacities identified. No pleural effusion or pneumothorax visualized. No acute osseous abnormality appreciated. IMPRESSION: No acute intrathoracic process identified. Electronically Signed   By: Ofilia Neas M.D.   On: 06/24/2021 11:30    Procedures None  Medications Ordered in ED Medications  potassium chloride 10 mEq in 100 mL IVPB (10 mEq Intravenous New Bag/Given 06/24/21 1352)  0.9 %  sodium chloride infusion ( Intravenous New Bag/Given 06/24/21 1351)  magnesium sulfate IVPB 2 g 50 mL (has no administration in time range)  acetaminophen (TYLENOL) tablet 650 mg (650 mg Oral Given 06/24/21 1033)  lactated ringers bolus 1,000 mL (0 mLs Intravenous Stopped 06/24/21  1307)  potassium chloride SA (KLOR-CON M) CR tablet 40 mEq (40 mEq Oral Given 06/24/21 1347)    ED Course/ Medical Decision Making/ A&P Patient presents with several days of difficulty breathing, cough, body aches, fever, fatigue nonresponsive to outpatient Keflex therapy.    Differential includes COVID-19, influenza, strep a pharyngitis pneumonia, PE.  Patient has not been tested for COVID-19, influenza, strep a pharyngitis though she has had 2 days of Keflex without improvement of symptoms.  Given physical exam finding of left lower lobe crackles with remainder of symptoms and lack of clinical response, it is possible that the patient has a superimposed bacterial pneumonia.  Well score of 1.5, PERC score of 2; the patient is hypertensive, tachypneic, tachycardic, however I feel that it is more likely that her symptoms are related to infection given lack of further supportive features indicating PE such as OCP or hormone replacement therapy, smoking or vaping, recent travel, recent surgery.  Work-up thus far has been negative  for COVID-19, influenza, group A strep.  CBC was unremarkable.  CMP was concerning for hypokalemia at 2.9, magnesium was 1.6.  Lactic acid elevated at 2.1.  UA concerning for rare bacteria, moderate hemoglobin.  Chest x-ray was negative for acute process.  Labs ordered: COVID-19 Influenza Group A strep CBC CMP Troponin Lactic acid UA Blood culture Urine culture Mononucleosis  Imaging ordered: Chest x-ray  Reevaluation: Patient did receive 1 L LR bolus.  Vital signs gradually improved throughout her time in the ED.  She did receive repletion of potassium and magnesium as well.  Disposition: Patient most likely has a bacterial upper respiratory infection.  She has been on Keflex to this point, 2 days of treatment.  Given hemodynamic improvement and remainder of work-up findings as above, she is stable for discharge home with continued course of Keflex as well as  additional course of azithromycin.  I will also send her home with potassium supplementation.  She is appropriate for discharge at this time with close follow-up with her PCP.  I discussed this case with my attending physician Dr. Billy Fischer who cosigned this note including patient's presenting symptoms, physical exam, and planned diagnostics and intervention.  Final Clinical Impression(s) / ED Diagnoses Final diagnoses:  Bacterial URI    Rx / DC Orders ED Discharge Orders     None         Farrel Gordon, DO 06/24/21 1412    Gareth Morgan, MD 06/24/21 2224

## 2021-06-24 NOTE — ED Triage Notes (Signed)
Pt arrives with c/o sore throat and shortness of breath for a few days states that she had a tele health visit on Monday reports that she was placed on an antibiotic for strep, was never tested. States that she feels like she is getting worse. Also reports her family had been sick around the week of christmas.

## 2021-06-24 NOTE — Discharge Instructions (Addendum)
You were evaluated today because of several days of difficulty breathing, cough, body aches, and fever despite having started antibiotic therapy.  We obtained several labs, including COVID-19, influenza, group A strep, urinalysis, all of which were negative.  We also obtained a chest x-ray which did not show any acute process like pneumonia.  You received fluids which did help normalize your blood pressure, and heart rate.  We did find that your potassium and magnesium levels were low, so these were both repleted before you left.  When you go home, you will have a prescription for azithromycin 500 mg once daily for 3 days.  I would also like you to finish the course of Keflex that you have already started.  Additionally, you will have a prescription for potassium tablets.  I advise that you return for reevaluation if your symptoms worsen over the next several days.

## 2021-06-24 NOTE — Progress Notes (Signed)
RT assessed in triage. BBS clear. Stated she feels like mostly upper airway congestion. SAT WNL

## 2021-06-26 LAB — URINE CULTURE: Culture: NO GROWTH

## 2021-06-29 LAB — CULTURE, BLOOD (ROUTINE X 2)
Culture: NO GROWTH
Culture: NO GROWTH
Special Requests: ADEQUATE
Special Requests: ADEQUATE

## 2021-11-04 LAB — HEPATITIS C ANTIBODY: HCV Ab: NEGATIVE

## 2021-11-04 LAB — OB RESULTS CONSOLE ABO/RH: RH Type: POSITIVE

## 2021-11-04 LAB — OB RESULTS CONSOLE RUBELLA ANTIBODY, IGM: Rubella: IMMUNE

## 2021-11-04 LAB — OB RESULTS CONSOLE HIV ANTIBODY (ROUTINE TESTING): HIV: NONREACTIVE

## 2021-11-04 LAB — OB RESULTS CONSOLE HEPATITIS B SURFACE ANTIGEN: Hepatitis B Surface Ag: NEGATIVE

## 2021-11-11 LAB — OB RESULTS CONSOLE GC/CHLAMYDIA
Chlamydia: NEGATIVE
Neisseria Gonorrhea: NEGATIVE

## 2021-12-20 IMAGING — US US THYROID
1 series · 13 of 25 positions shown · non-contrast
Comparison: None.

CLINICAL DATA: Other. History of thyroid nodule, post right thyroid
lobectomy and Hashimoto's disease

EXAM:
THYROID ULTRASOUND
TECHNIQUE: Ultrasound examination of the thyroid gland and adjacent soft
tissues was performed.

[Series 1: us thyroid · 0.06mm/px · 13 of 30 slices shown]
[im 1/30]
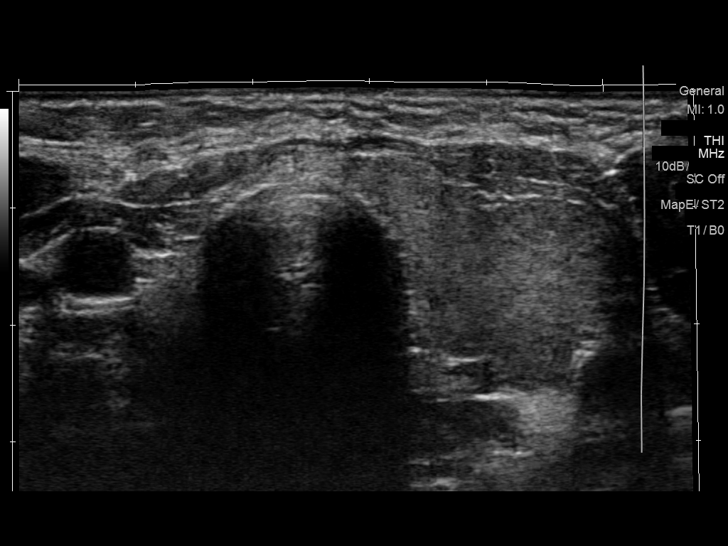
[im 3/30]
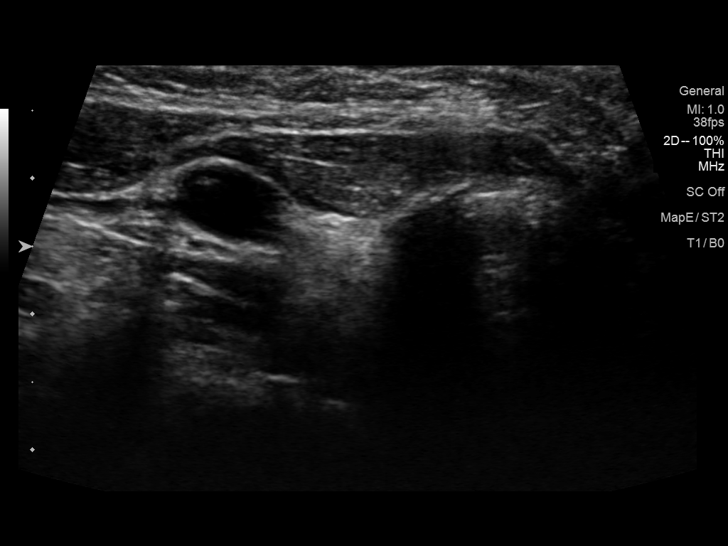
[im 5/30]
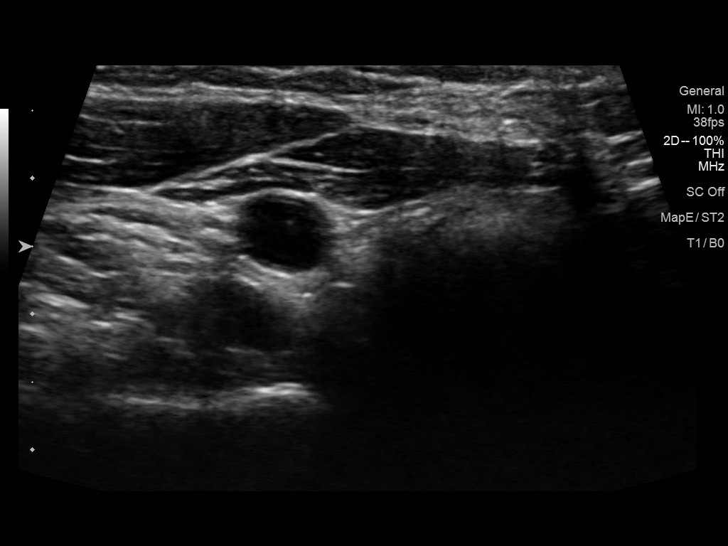
[im 8/30]
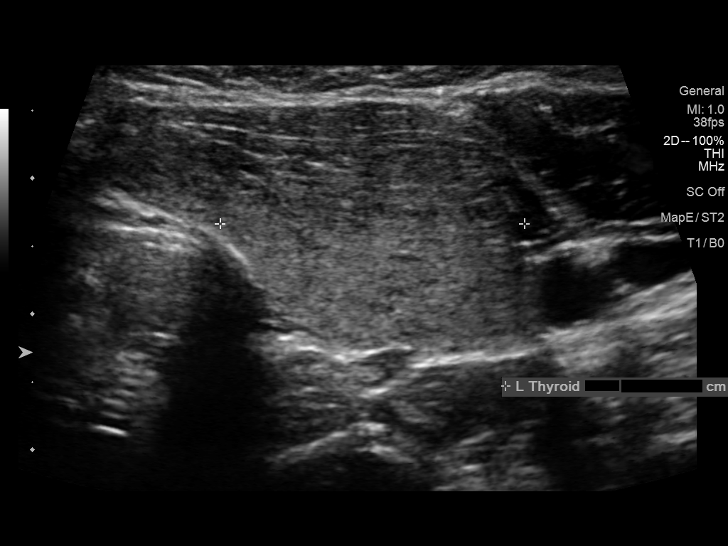
[im 10/30]
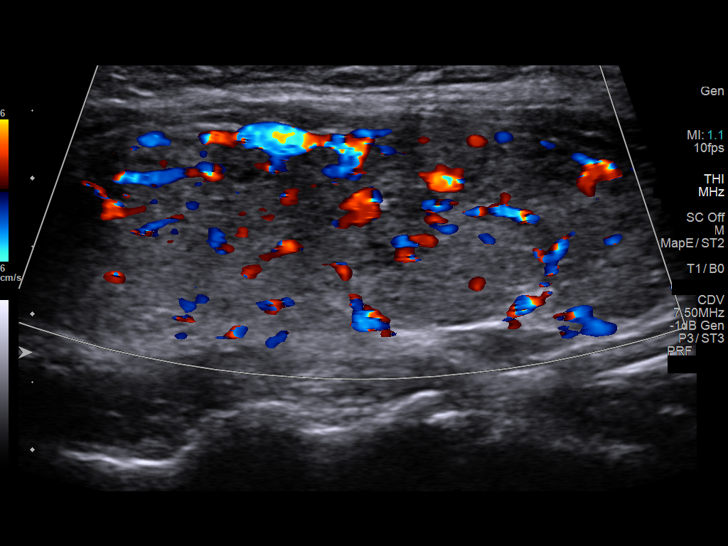
[im 13/30]
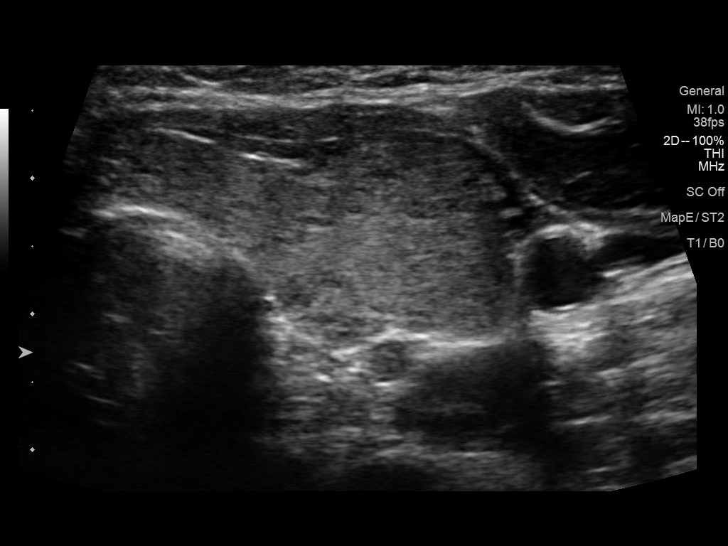
[im 15/30]
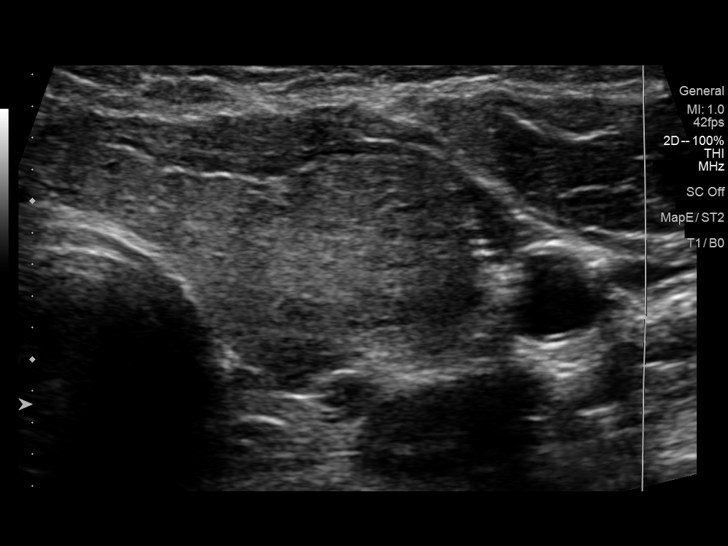
[im 17/30]
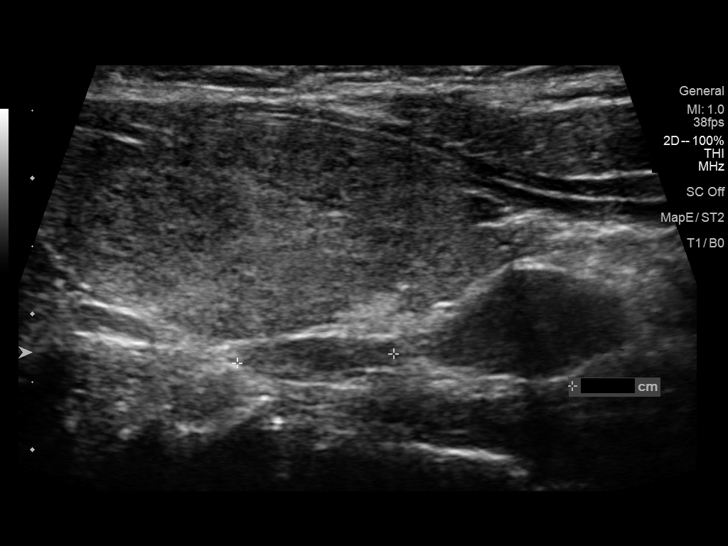
[im 20/30]
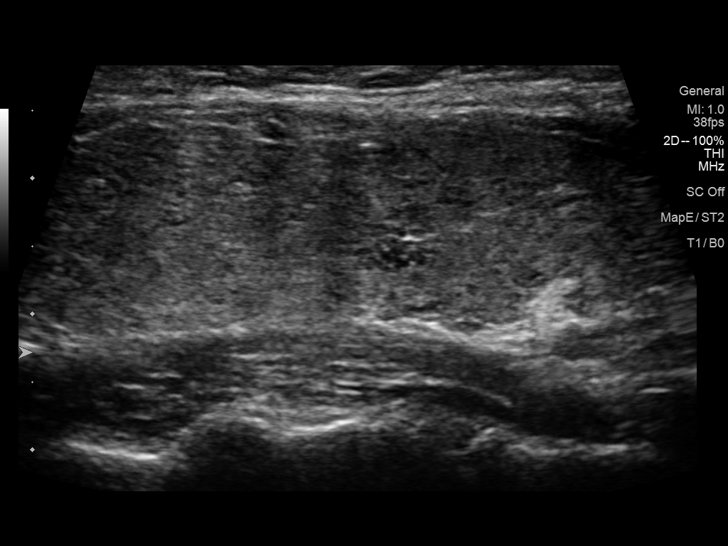
[im 22/30]
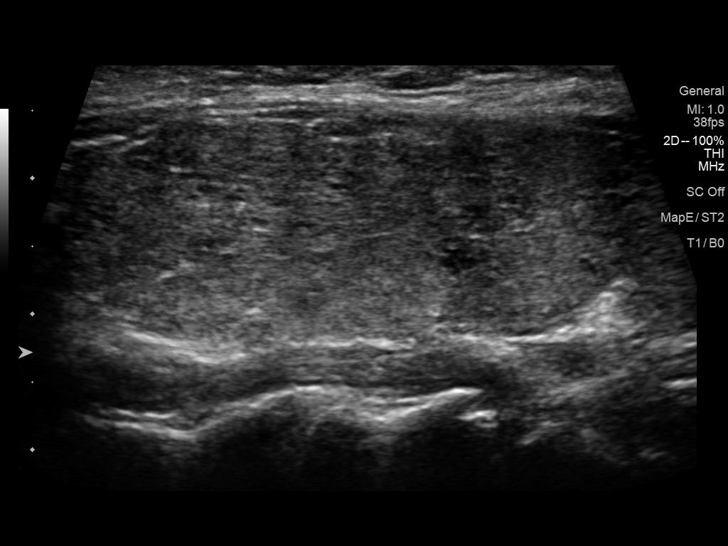
[im 25/30]
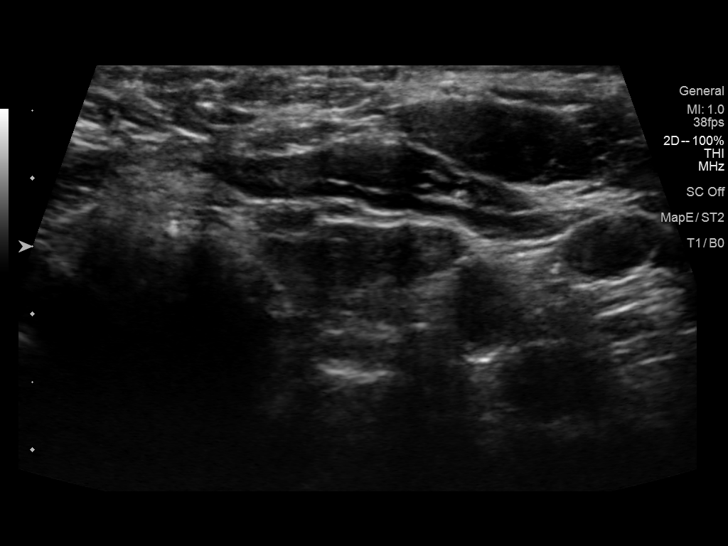
[im 27/30]
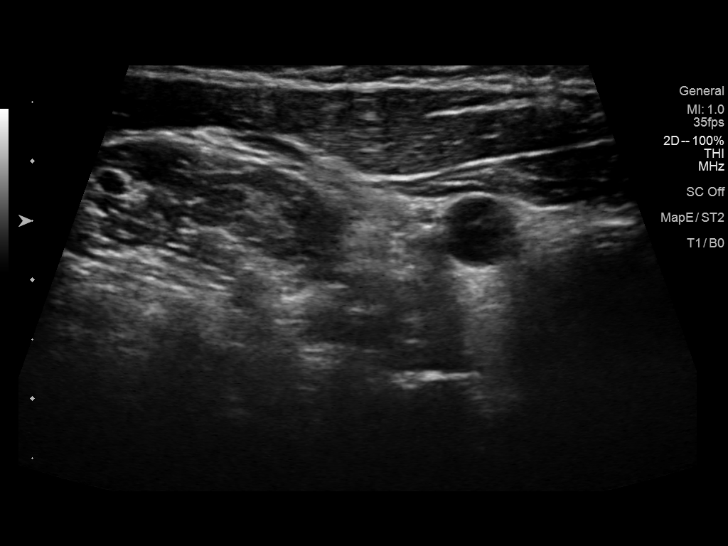
[im 30/30]
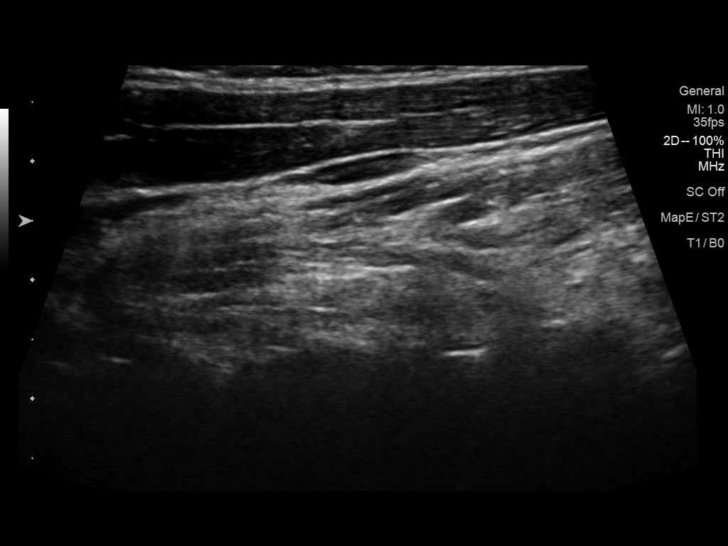

[13 of 25 positions shown; findings below may reference images not displayed]

FINDINGS: Parenchymal Echotexture: Moderately heterogenous

Isthmus: Normal in size measures 0.2 cm in diameter

Right lobe: Surgically absent. There is no residual nodular soft
tissue within the right lobectomy resection bed.

Left lobe: Normal in size measuring 5.2 x 1.6 x 2.2 cm

_________________________________________________________

Estimated total number of nodules >/= 1 cm: 1

Number of spongiform nodules >/=  2 cm not described below (TR1): 0

Number of mixed cystic and solid nodules >/= 1.5 cm not described
below (TR2): 0

_________________________________________________________

No discrete nodules are seen within the thyroid gland.

Adjacent to the inferior pole the left lobe of the thyroid are 2
adjacent hypoechoic nodules which are nonspecific though favored to
represent cervical lymph nodes. Dominant suspected lymph node is not
enlarged by size criteria measuring 0.7 cm in greatest short axis
diameter (image 19).
IMPRESSION: 1. Post right thyroid lobectomy without evidence of residual or
locally recurrent disease.
2. Moderately heterogeneous but otherwise normal-appearing remaining
thyroid parenchyma without discrete nodule or mass.

The above is in keeping with the ACR TI-RADS recommendations - [HOSPITAL] 5944;[DATE].

## 2022-02-22 IMAGING — CT CT ABD-PELV W/ CM
2 of 4 series · 16 of 46 positions shown, 18 images · IV contrast (iopamidol)
Comparison: None

CLINICAL DATA: Lower pelvic mass for 4 months, decreased bladder
capacity

EXAM:
CT ABDOMEN AND PELVIS WITH CONTRAST
TECHNIQUE: Multidetector CT imaging of the abdomen and pelvis was performed
using the standard protocol following bolus administration of
intravenous contrast. Sagittal and coronal MPR images reconstructed
from axial data set.
CONTRAST:  100mL VOEM9A-DRR IOPAMIDOL (VOEM9A-DRR) INJECTION 61% IV.
Dilute oral contrast.

[Series 4: abd pelvis 5.00 br40 s3 axial · axial · 0.70mm/px · z∈[+1092,+1502]mm · 13 of 92 slices shown, 15 images]
[im 5/92  soft-tissue]
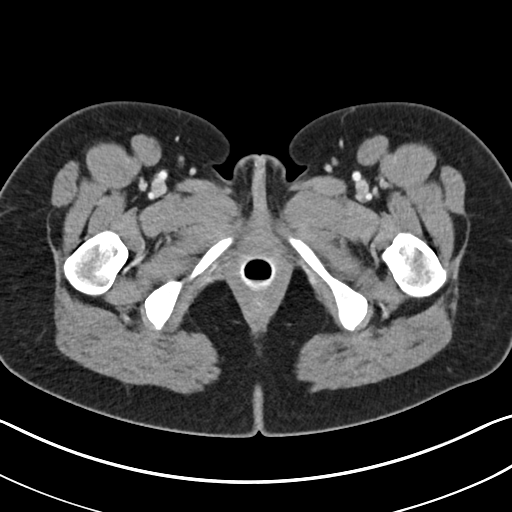
[im 5/92  bone]
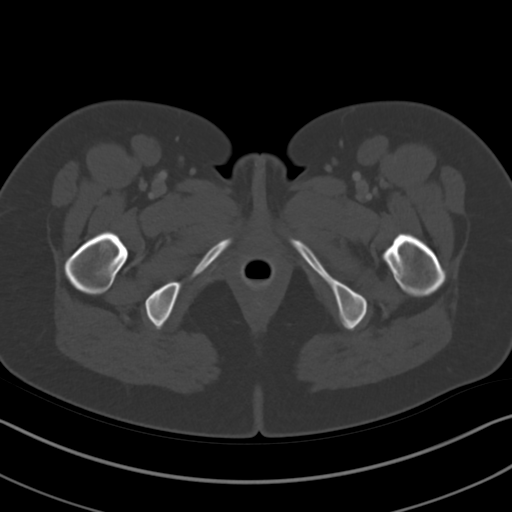
[im 14/92  soft-tissue]
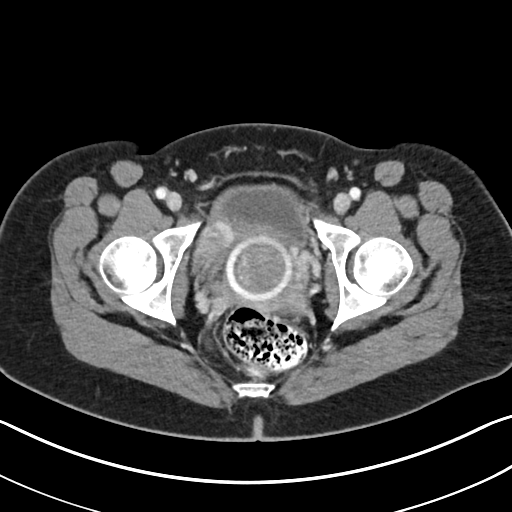
[im 19/92  soft-tissue]
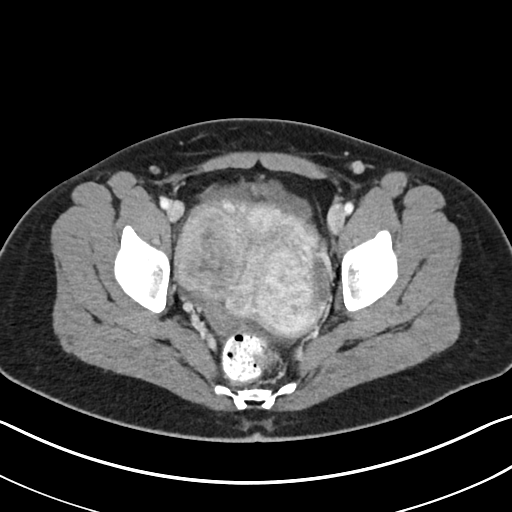
[im 28/92  soft-tissue]
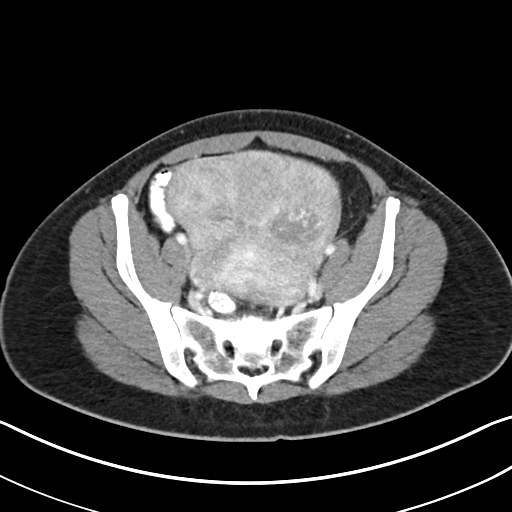
[im 32/92  soft-tissue]
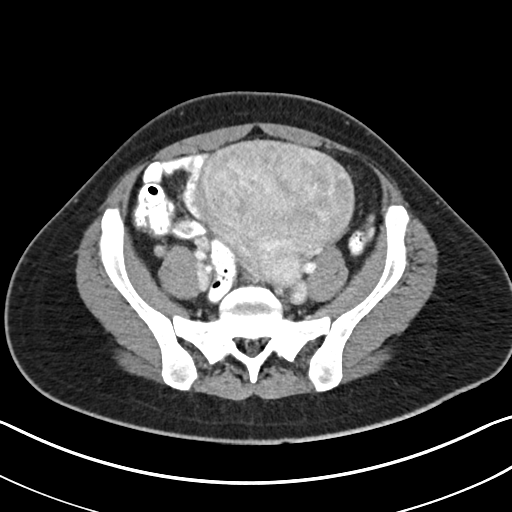
[im 41/92  soft-tissue]
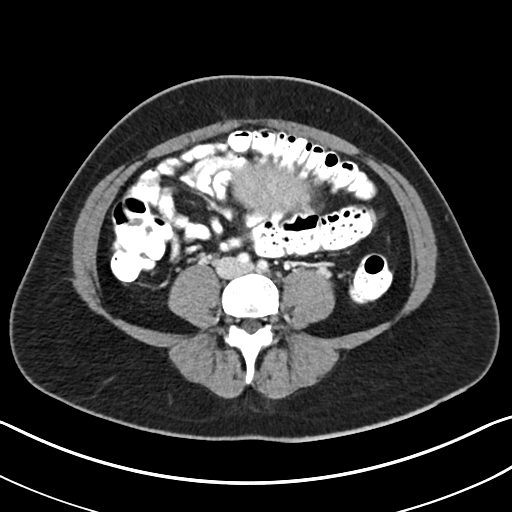
[im 46/92  soft-tissue]
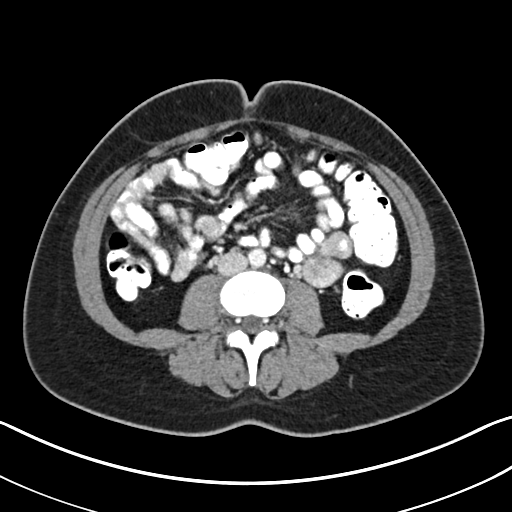
[im 51/92  soft-tissue]
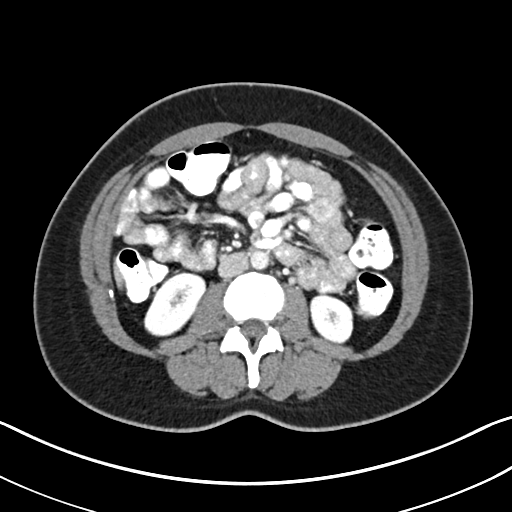
[im 60/92  soft-tissue]
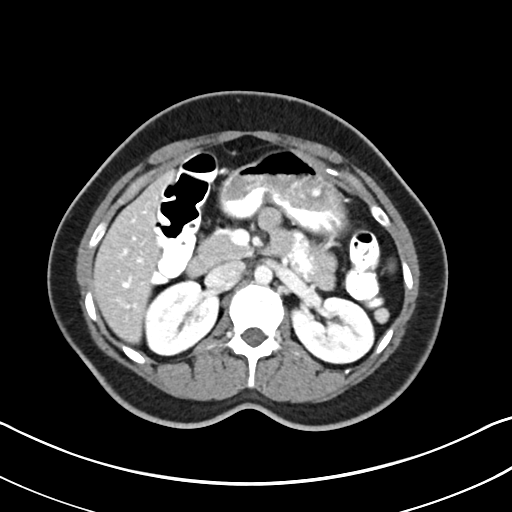
[im 60/92  bone]
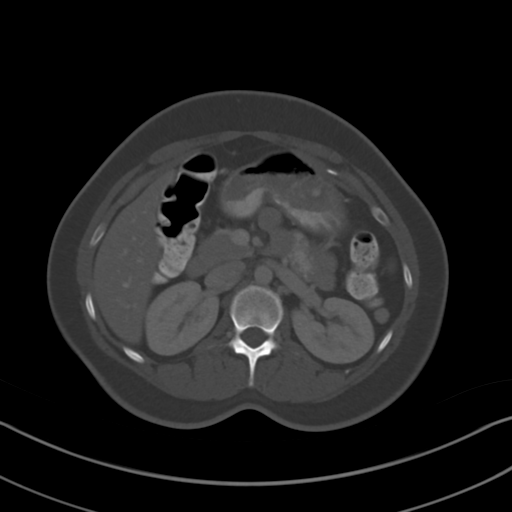
[im 64/92  soft-tissue]
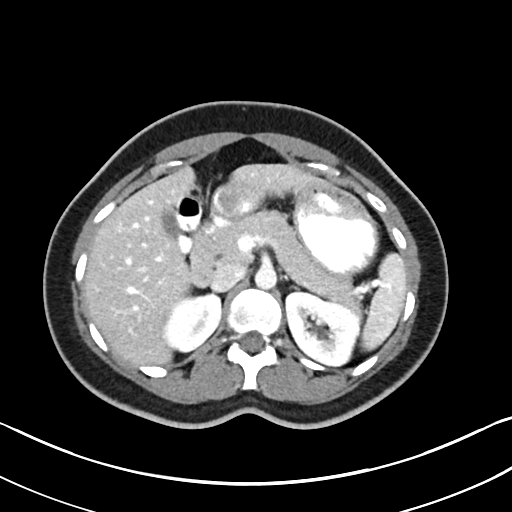
[im 73/92  soft-tissue]
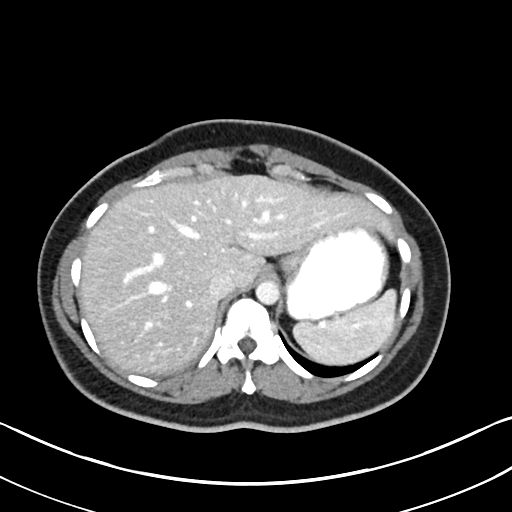
[im 78/92  soft-tissue]
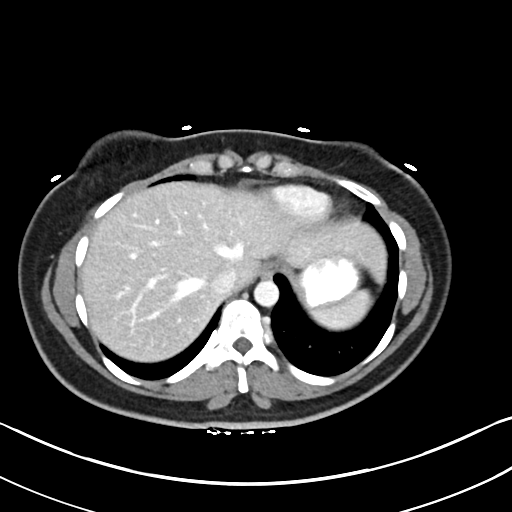
[im 87/92  soft-tissue]
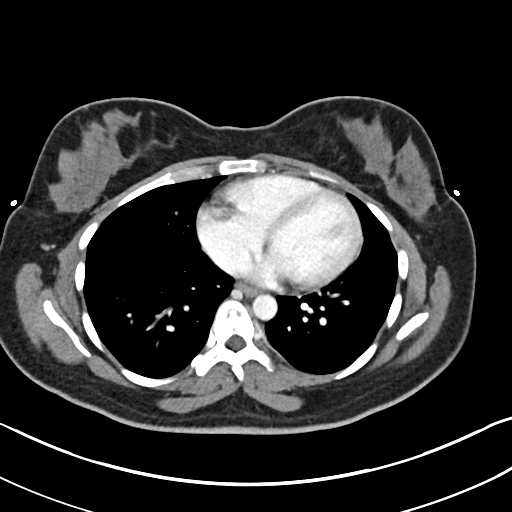

[Series 8: abd pelvis 2.00 br40 s3 cor · coronal · 0.70mm/px · 3 of 140 slices shown]
[im 47/140  soft-tissue]
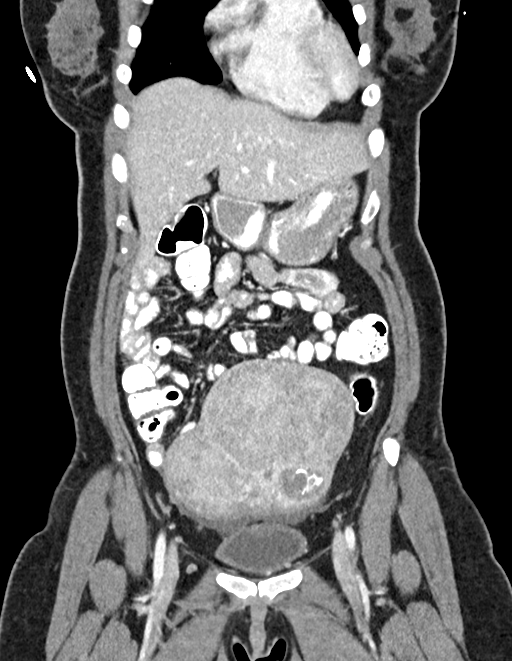
[im 62/140  soft-tissue]
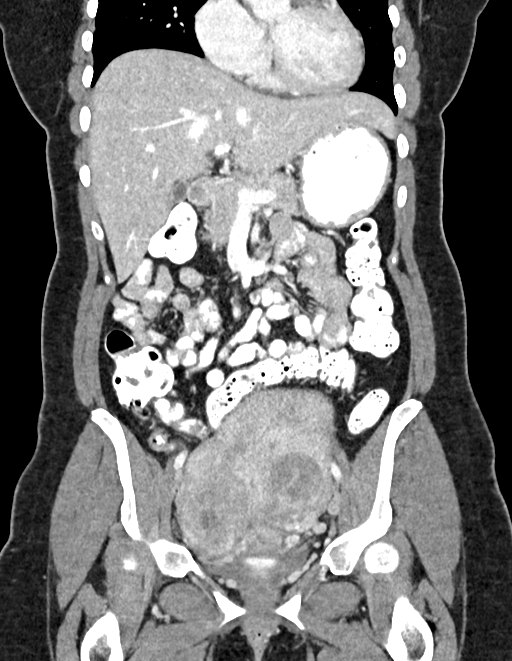
[im 78/140  soft-tissue]
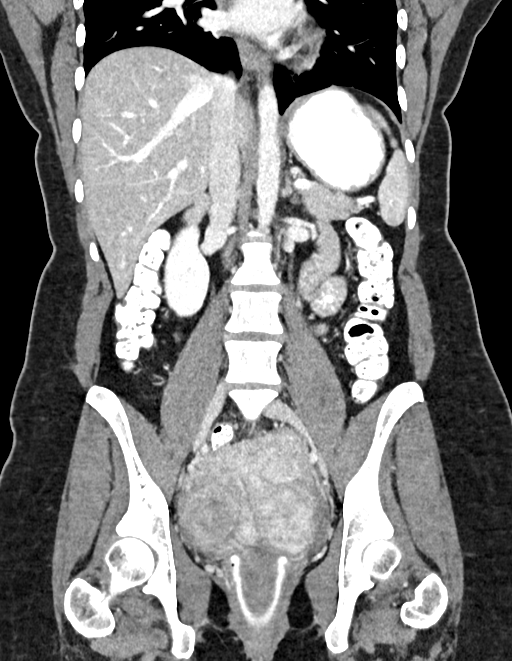

[16 of 46 positions shown; findings below may reference images not displayed]

FINDINGS: Lower chest: Lung bases clear

Hepatobiliary: Gallbladder and liver normal appearance

Pancreas: Normal appearance

Spleen: Normal appearance

Adrenals/Urinary Tract: 2 mm calculus at inferior pole RIGHT kidney.
Adrenal glands, kidneys, and ureters otherwise normal appearance.
Decreased bladder volume secondary to uterine findings see below. No
bladder mass.

Stomach/Bowel: Normal appendix, retrocecal. Filling defect within a
proximal small bowel loop in the LEFT mid abdomen, favor food
debris, does not have typical appearance of intussusception. Stomach
and bowel loops otherwise normal appearance

Vascular/Lymphatic: Vascular structures patent.  No adenopathy.

Reproductive: Markedly enlarged uterus 16.7 x 8.3 x 12.6 cm (volume
= 910 cm^3) containing multiple masses consistent with uterine
leiomyomata. Largest of these measure 6.9 cm lower LEFT uterus and
11.0 cm superior LEFT uterus. Unremarkable ovaries. Diaphragm in
vagina.

Other: Minimal free fluid. No free air. Tiny umbilical hernia
containing fat. No acute inflammatory process.

Musculoskeletal: Unremarkable
IMPRESSION: Markedly enlarged uterus containing multiple masses consistent with
uterine leiomyomata up to 11 cm diameter.

2 mm nonobstructing calculus at inferior pole RIGHT kidney.

Tiny umbilical hernia containing fat.

No other intra-abdominal or intrapelvic abnormalities.

## 2022-03-05 LAB — OB RESULTS CONSOLE RPR: RPR: NONREACTIVE

## 2022-05-11 ENCOUNTER — Other Ambulatory Visit: Payer: Self-pay

## 2022-05-11 ENCOUNTER — Encounter: Payer: Self-pay | Admitting: Student

## 2022-05-11 ENCOUNTER — Inpatient Hospital Stay (HOSPITAL_COMMUNITY)
Admission: AD | Admit: 2022-05-11 | Discharge: 2022-05-11 | Disposition: A | Discharge status: Home / Self Care | Payer: Medicaid Other | Attending: Obstetrics and Gynecology | Admitting: Obstetrics and Gynecology

## 2022-05-11 DIAGNOSIS — O133 Gestational [pregnancy-induced] hypertension without significant proteinuria, third trimester: Secondary | ICD-10-CM | POA: Diagnosis not present

## 2022-05-11 DIAGNOSIS — Z3A34 34 weeks gestation of pregnancy: Secondary | ICD-10-CM | POA: Diagnosis not present

## 2022-05-11 DIAGNOSIS — O1493 Unspecified pre-eclampsia, third trimester: Secondary | ICD-10-CM | POA: Insufficient documentation

## 2022-05-11 DIAGNOSIS — O99283 Endocrine, nutritional and metabolic diseases complicating pregnancy, third trimester: Secondary | ICD-10-CM | POA: Insufficient documentation

## 2022-05-11 LAB — URINALYSIS, ROUTINE W REFLEX MICROSCOPIC
Bacteria, UA: NONE SEEN
Bilirubin Urine: NEGATIVE
Glucose, UA: NEGATIVE mg/dL
Ketones, ur: NEGATIVE mg/dL
Leukocytes,Ua: NEGATIVE
Nitrite: NEGATIVE
Protein, ur: NEGATIVE mg/dL
Specific Gravity, Urine: 1.011 (ref 1.005–1.030)
pH: 7 (ref 5.0–8.0)

## 2022-05-11 LAB — CBC
HCT: 30.1 % — ABNORMAL LOW (ref 36.0–46.0)
Hemoglobin: 10 g/dL — ABNORMAL LOW (ref 12.0–15.0)
MCH: 26.9 pg (ref 26.0–34.0)
MCHC: 33.2 g/dL (ref 30.0–36.0)
MCV: 80.9 fL (ref 80.0–100.0)
Platelets: 200 10*3/uL (ref 150–400)
RBC: 3.72 MIL/uL — ABNORMAL LOW (ref 3.87–5.11)
RDW: 13.5 % (ref 11.5–15.5)
WBC: 7.8 10*3/uL (ref 4.0–10.5)
nRBC: 0 % (ref 0.0–0.2)

## 2022-05-11 LAB — PROTEIN / CREATININE RATIO, URINE
Creatinine, Urine: 66 mg/dL
Protein Creatinine Ratio: 0.24 mg/mg{Cre} — ABNORMAL HIGH (ref 0.00–0.15)
Total Protein, Urine: 16 mg/dL

## 2022-05-11 LAB — COMPREHENSIVE METABOLIC PANEL
ALT: 10 U/L (ref 0–44)
AST: 16 U/L (ref 15–41)
Albumin: 2.6 g/dL — ABNORMAL LOW (ref 3.5–5.0)
Alkaline Phosphatase: 105 U/L (ref 38–126)
Anion gap: 11 (ref 5–15)
BUN: <5 mg/dL — ABNORMAL LOW (ref 6–20)
CO2: 19 mmol/L — ABNORMAL LOW (ref 22–32)
Calcium: 9 mg/dL (ref 8.9–10.3)
Chloride: 106 mmol/L (ref 98–111)
Creatinine, Ser: 0.49 mg/dL (ref 0.44–1.00)
GFR, Estimated: >60 mL/min (ref >60)
Glucose, Bld: 104 mg/dL — ABNORMAL HIGH (ref 70–99)
Potassium: 3.6 mmol/L (ref 3.5–5.1)
Sodium: 136 mmol/L (ref 135–145)
Total Bilirubin: 0.3 mg/dL (ref 0.3–1.2)
Total Protein: 6.6 g/dL (ref 6.5–8.1)

## 2022-05-11 NOTE — MAU Note (Signed)
Amber Kelley is a 36 y.o. at 58w4dhere in MAU reporting: she was sent from office for BP evaluation.  Reports BP in office was 165/101 & 160/101.  Denies HA, visual disturbances, and epigastric pain.  Denies VB or LOF.  Endorses +FM. LMP: NA Onset of complaint: today Pain score: 0 Vitals:   05/11/22 1357  BP: (!) 154/94  Pulse: 71  Resp: 19  Temp: (!) 97.4 F (36.3 C)  SpO2: 98%     FHT:126 bpm Lab orders placed from triage:  UA

## 2022-05-11 NOTE — MAU Provider Note (Cosign Needed Addendum)
History     CSN: [id number]  Arrival date and time: 05/11/22 1337   Event Date/Time   First Provider Initiated Contact with Patient 05/11/22 1428      Chief Complaint  Patient presents with   BP Evaluation   Amber Kelley is a 36 y.o. G2P0010 female at 23w4dby LMP who presents after having multiple high BP in the office today. Her BP at that time was 165/101 and then 160/101 on recheck. She has not had any HA, visual changes, abnormal swelling, abdominal pain, VB, or LOF. She does mention she had these high BP again last week. Does not have a documented history of pre-e, and her last pregnancy resulted in a loss.  OB History     Gravida  2   Para  0   Term  0   Preterm  0   AB  1   Living  0      SAB  1   IAB      Ectopic      Multiple      Live Births              Past Medical History:  Diagnosis Date   History of kidney stones    per patient was told has one kidney stone   Hypothyroidism    Umbilical hernia    per patient, was told has a small umbilical hernia     Past Surgical History:  Procedure Laterality Date   EYE SURGERY Bilateral 2019   per patient "CORNEAL CROSS-LINKING"   MYOMECTOMY N/A 05/07/2020   Procedure: ABDOMINAL MYOMECTOMY;  Surgeon: DDrema Dallas DO;  Location: [facility]  Service: Gynecology;  Laterality: N/A;    History reviewed. No pertinent family history.  Social History   Tobacco Use   Smoking status: Never   Smokeless tobacco: Never  Vaping Use   Vaping Use: Never used  Substance Use Topics   Alcohol use: Yes    Comment: rarely   Drug use: Never    Allergies: No Known Allergies  Medications Prior to Admission  Medication Sig Dispense Refill Last Dose   acetaminophen (TYLENOL) 500 MG tablet Take 500 mg by mouth every 6 (six) hours as needed for moderate pain or headache.   05/10/2022   fluticasone (FLONASE) 50 MCG/ACT nasal spray Place into both nostrils.   05/11/2022   levothyroxine (SYNTHROID) 88  MCG tablet Take 88 mcg by mouth daily before breakfast.    05/11/2022   progesterone (PROMETRIUM) 200 MG capsule Place 200 mg vaginally at bedtime.      sertraline (ZOLOFT) 25 MG tablet Take 25 mg by mouth daily.   Past Week   phenylephrine (SUDAFED PE) 10 MG TABS tablet Take 10 mg by mouth as needed (at night time).      potassium chloride (KLOR-CON M) 10 MEQ tablet Take 1 tablet (10 mEq total) by mouth 2 (two) times daily. 10 tablet 0    ROS as per HPI.  Physical Exam   Blood pressure (!) 147/96, pulse 70, temperature (!) 97.4 F (36.3 C), temperature source Oral, resp. rate 19, height '5\' 2"'$  (1.575 m), weight 70.9 kg, last menstrual period 09/11/2021, SpO2 96 %.  Patient Vitals for the past 24 hrs:  BP Temp Temp src Pulse Resp SpO2 Height Weight  05/11/22 1645 (!) 149/92 -- -- 72 -- 100 % -- --  05/11/22 1631 (!) 148/92 -- -- 65 -- 100 % -- --  05/11/22 1616 (!) 143/96 -- --  75 -- -- -- --  05/11/22 1600 (!) 152/95 -- -- 71 -- 100 % -- --  05/11/22 1545 (!) 138/93 -- -- 71 -- 99 % -- --  05/11/22 1530 (!) 140/94 -- -- 71 -- 100 % -- --  05/11/22 1500 (!) 147/96 -- -- 70 -- 96 % -- --  05/11/22 1445 (!) 152/102 -- -- 76 -- 98 % -- --  05/11/22 1430 (!) 153/96 -- -- 76 -- 98 % -- --  05/11/22 1415 (!) 144/94 -- -- -- -- -- -- --  05/11/22 1357 (!) 154/94 (!) 97.4 F (36.3 C) Oral 71 19 98 % -- --  05/11/22 1352 -- -- -- -- -- -- '5\' 2"'$  (1.575 m) 70.9 kg     Physical Exam Constitutional:      General: She is not in acute distress.    Appearance: Normal appearance. She is not ill-appearing.  HENT:     Head: Normocephalic and atraumatic.  Eyes:     Extraocular Movements: Extraocular movements intact.  Cardiovascular:     Rate and Rhythm: Normal rate.  Pulmonary:     Effort: Pulmonary effort is normal. No respiratory distress.  Abdominal:     Comments: Gravid abdomen  Neurological:     General: No focal deficit present.     Mental Status: She is alert.  Psychiatric:         Mood and Affect: Mood normal.        Behavior: Behavior normal.    MAU Course   Results for orders placed or performed during the hospital encounter of 05/11/22 (from the past 24 hour(s))  Comprehensive metabolic panel     Status: Abnormal   Collection Time: 05/11/22  2:21 PM  Result Value Ref Range   Sodium 136 135 - 145 mmol/L   Potassium 3.6 3.5 - 5.1 mmol/L   Chloride 106 98 - 111 mmol/L   CO2 19 (L) 22 - 32 mmol/L   Glucose, Bld 104 (H) 70 - 99 mg/dL   BUN <5 (L) 6 - 20 mg/dL   Creatinine, Ser 0.49 0.44 - 1.00 mg/dL   Calcium 9.0 8.9 - 10.3 mg/dL   Total Protein 6.6 6.5 - 8.1 g/dL   Albumin 2.6 (L) 3.5 - 5.0 g/dL   AST 16 15 - 41 U/L   ALT 10 0 - 44 U/L   Alkaline Phosphatase 105 38 - 126 U/L   Total Bilirubin 0.3 0.3 - 1.2 mg/dL   GFR, Estimated >60 >60 mL/min   Anion gap 11 5 - 15  CBC     Status: Abnormal   Collection Time: 05/11/22  2:36 PM  Result Value Ref Range   WBC 7.8 4.0 - 10.5 K/uL   RBC 3.72 (L) 3.87 - 5.11 MIL/uL   Hemoglobin 10.0 (L) 12.0 - 15.0 g/dL   HCT 30.1 (L) 36.0 - 46.0 %   MCV 80.9 80.0 - 100.0 fL   MCH 26.9 26.0 - 34.0 pg   MCHC 33.2 30.0 - 36.0 g/dL   RDW 13.5 11.5 - 15.5 %   Platelets 200 150 - 400 K/uL   nRBC 0.0 0.0 - 0.2 %  Protein / creatinine ratio, urine     Status: Abnormal   Collection Time: 05/11/22  2:46 PM  Result Value Ref Range   Creatinine, Urine 66 mg/dL   Total Protein, Urine 16 mg/dL   Protein Creatinine Ratio 0.24 (H) 0.00 - 0.15 mg/mg[Cre]  Urinalysis, Routine w reflex microscopic Urine, Clean  Catch     Status: Abnormal   Collection Time: 05/11/22  2:46 PM  Result Value Ref Range   Color, Urine YELLOW YELLOW   APPearance CLEAR CLEAR   Specific Gravity, Urine 1.011 1.005 - 1.030   pH 7.0 5.0 - 8.0   Glucose, UA NEGATIVE NEGATIVE mg/dL   Hgb urine dipstick SMALL (A) NEGATIVE   Bilirubin Urine NEGATIVE NEGATIVE   Ketones, ur NEGATIVE NEGATIVE mg/dL   Protein, ur NEGATIVE NEGATIVE mg/dL   Nitrite NEGATIVE NEGATIVE    Leukocytes,Ua NEGATIVE NEGATIVE   RBC / HPF 6-10 0 - 5 RBC/hpf   WBC, UA 0-5 0 - 5 WBC/hpf   Bacteria, UA NONE SEEN NONE SEEN   Mucus PRESENT      MDM CBC - normocytic anemia to 10 CMP- overall unremarkable Urine pr/cr ratio - 0.24 UA - small Hgb  Assessment and Plan  Amber Kelley is a 36 y.o. G43P0010 female at 6w4dby LMP who presents for evaluation of pre-eclampsia after severe range BP in the office today.   She is asymptomatic which is reassuring. Chart review reveals elevated BP not within severe range, though these were taken when she was acutely ill. BP here today elevated but not in severe range. Labs and FHT unremarkable.   Given stability, she can discharge home with close BP follow up outpatient.  BEthelene Hal MD 05/11/2022, 3:25 PM     Attestation of Supervision of Student:  I confirm that I have verified the information documented in the resident's note.  I have verified that all services and findings are accurately documented in this student's note; and I agree with management and plan as outlined in the documentation. I have also made any necessary editorial changes.  Patient with elevated BPs in the office last week & persistently elevated in MAU today. None severe range, asymptomatic, and normal labs. Meets criteria for gestational hypertension diagnosis. Already scheduled for 37 week c/section due to hx of myomectomy.   Dr. DDelora Fuelnotified of evaluation & will get patient appropriate follow up.    NST:  Baseline: 125 bpm, Variability: Good {> 6 bpm), Accelerations: Reactive, and Decelerations: Absent   EJorje Guild NP [facility] 05/11/2022 5:59 PM

## 2022-05-17 ENCOUNTER — Other Ambulatory Visit: Payer: Self-pay

## 2022-05-17 ENCOUNTER — Encounter (HOSPITAL_COMMUNITY): Payer: Self-pay | Admitting: Obstetrics and Gynecology

## 2022-05-17 ENCOUNTER — Inpatient Hospital Stay (HOSPITAL_COMMUNITY)
Admission: AD | Admit: 2022-05-17 | Discharge: 2022-05-17 | Disposition: A | Discharge status: Home / Self Care | Payer: Medicaid Other | Attending: Obstetrics and Gynecology | Admitting: Obstetrics and Gynecology

## 2022-05-17 DIAGNOSIS — K589 Irritable bowel syndrome without diarrhea: Secondary | ICD-10-CM | POA: Insufficient documentation

## 2022-05-17 DIAGNOSIS — E039 Hypothyroidism, unspecified: Secondary | ICD-10-CM | POA: Insufficient documentation

## 2022-05-17 DIAGNOSIS — Z79890 Hormone replacement therapy (postmenopausal): Secondary | ICD-10-CM | POA: Insufficient documentation

## 2022-05-17 DIAGNOSIS — O99283 Endocrine, nutritional and metabolic diseases complicating pregnancy, third trimester: Secondary | ICD-10-CM | POA: Insufficient documentation

## 2022-05-17 DIAGNOSIS — O99613 Diseases of the digestive system complicating pregnancy, third trimester: Secondary | ICD-10-CM | POA: Insufficient documentation

## 2022-05-17 DIAGNOSIS — Z79899 Other long term (current) drug therapy: Secondary | ICD-10-CM | POA: Diagnosis not present

## 2022-05-17 DIAGNOSIS — Z3A35 35 weeks gestation of pregnancy: Secondary | ICD-10-CM | POA: Diagnosis not present

## 2022-05-17 DIAGNOSIS — F32A Depression, unspecified: Secondary | ICD-10-CM | POA: Diagnosis not present

## 2022-05-17 DIAGNOSIS — O99343 Other mental disorders complicating pregnancy, third trimester: Secondary | ICD-10-CM | POA: Insufficient documentation

## 2022-05-17 DIAGNOSIS — Z98891 History of uterine scar from previous surgery: Secondary | ICD-10-CM | POA: Diagnosis not present

## 2022-05-17 DIAGNOSIS — Z3689 Encounter for other specified antenatal screening: Secondary | ICD-10-CM | POA: Diagnosis not present

## 2022-05-17 DIAGNOSIS — O1493 Unspecified pre-eclampsia, third trimester: Secondary | ICD-10-CM

## 2022-05-17 DIAGNOSIS — O163 Unspecified maternal hypertension, third trimester: Secondary | ICD-10-CM | POA: Diagnosis present

## 2022-05-17 HISTORY — DX: Keratoconus, unspecified, bilateral: H18.603

## 2022-05-17 LAB — COMPREHENSIVE METABOLIC PANEL
ALT: 8 U/L (ref 0–44)
AST: 15 U/L (ref 15–41)
Albumin: 2.7 g/dL — ABNORMAL LOW (ref 3.5–5.0)
Alkaline Phosphatase: 116 U/L (ref 38–126)
Anion gap: 12 (ref 5–15)
BUN: <5 mg/dL — ABNORMAL LOW (ref 6–20)
CO2: 22 mmol/L (ref 22–32)
Calcium: 9.6 mg/dL (ref 8.9–10.3)
Chloride: 102 mmol/L (ref 98–111)
Creatinine, Ser: 0.49 mg/dL (ref 0.44–1.00)
GFR, Estimated: >60 mL/min (ref >60)
Glucose, Bld: 95 mg/dL (ref 70–99)
Potassium: 3.7 mmol/L (ref 3.5–5.1)
Sodium: 136 mmol/L (ref 135–145)
Total Bilirubin: 0.4 mg/dL (ref 0.3–1.2)
Total Protein: 7 g/dL (ref 6.5–8.1)

## 2022-05-17 LAB — CBC
HCT: 34.1 % — ABNORMAL LOW (ref 36.0–46.0)
Hemoglobin: 10.8 g/dL — ABNORMAL LOW (ref 12.0–15.0)
MCH: 26.1 pg (ref 26.0–34.0)
MCHC: 31.7 g/dL (ref 30.0–36.0)
MCV: 82.4 fL (ref 80.0–100.0)
Platelets: 191 10*3/uL (ref 150–400)
RBC: 4.14 MIL/uL (ref 3.87–5.11)
RDW: 13.5 % (ref 11.5–15.5)
WBC: 8.2 10*3/uL (ref 4.0–10.5)
nRBC: 0 % (ref 0.0–0.2)

## 2022-05-17 LAB — PROTEIN / CREATININE RATIO, URINE
Creatinine, Urine: 75 mg/dL
Protein Creatinine Ratio: 0.32 mg/mg{Cre} — ABNORMAL HIGH (ref 0.00–0.15)
Total Protein, Urine: 24 mg/dL

## 2022-05-17 NOTE — MAU Note (Addendum)
36 yo G2P0 presents w primary complaint of hypertension. Pt had a BP check in the office today that was 159/103 and was sent over for serial Bps. Denies visual changes and headache. +FM, no contractions, bleeding or LOF.

## 2022-05-17 NOTE — MAU Provider Note (Cosign Needed Addendum)
History     CSN: [id number]  Arrival date and time: 05/17/22 1755   Event Date/Time   First Provider Initiated Contact with Patient 05/17/22 1856      Chief Complaint  Patient presents with   Hypertension   36 y.o. G2P0010 '@35'$ .3 wks sent from office for elevated BP. BP in office today was 158/108. Denies HA, visual disturbances, RUQ pain, SOB, and CP. Reports good FM. No pregnancy complaints. She is scheduled for CS on 12/12 d/t previous myomectomy.       OB History     Gravida  2   Para  0   Term  0   Preterm  0   AB  1   Living  0      SAB  1   IAB      Ectopic      Multiple      Live Births              Past Medical History:  Diagnosis Date   Hypothyroidism    Keratoconus of both eyes     Past Surgical History:  Procedure Laterality Date   EYE SURGERY Bilateral 2019   per patient "CORNEAL CROSS-LINKING"   MYOMECTOMY N/A 05/07/2020   Procedure: ABDOMINAL MYOMECTOMY;  Surgeon: Drema Dallas, DO;  Location: [facility];  Service: Gynecology;  Laterality: N/A;   THYROIDECTOMY, PARTIAL  2012    No family history on file.  Social History   Tobacco Use   Smoking status: Never   Smokeless tobacco: Never  Vaping Use   Vaping Use: Never used  Substance Use Topics   Alcohol use: Not Currently    Comment: rarely   Drug use: Never    Allergies: No Known Allergies  No medications prior to admission.    Review of Systems  Eyes:  Negative for visual disturbance.  Respiratory:  Negative for shortness of breath.   Cardiovascular:  Negative for chest pain.  Gastrointestinal:  Negative for abdominal pain.  Genitourinary:  Negative for vaginal bleeding.  Neurological:  Negative for headaches.   Physical Exam   Blood pressure (!) 153/98, pulse 76, temperature 98.4 F (36.9 C), temperature source Oral, resp. rate 16, height '5\' 2"'$  (1.575 m), weight 70.9 kg, last menstrual period 09/11/2021, SpO2 99 %. Patient Vitals for the past 24 hrs:  BP  Temp Temp src Pulse Resp SpO2 Height Weight  05/17/22 2016 (!) 153/98 -- -- 76 -- -- -- --  05/17/22 2001 (!) 149/99 -- -- 82 -- -- -- --  05/17/22 1946 (!) 151/103 -- -- 80 -- -- -- --  05/17/22 1931 (!) 142/96 -- -- 70 -- -- -- --  05/17/22 1916 (!) 154/105 -- -- 34 -- -- -- --  05/17/22 1901 (!) 151/98 -- -- 9 -- -- -- --  05/17/22 1842 -- -- -- -- -- 99 % -- --  05/17/22 1841 (!) 153/88 98.4 F (36.9 C) Oral 70 16 -- '5\' 2"'$  (1.575 m) 70.9 kg    Physical Exam Vitals and nursing note reviewed.  Constitutional:      General: She is not in acute distress.    Appearance: Normal appearance.  HENT:     Head: Normocephalic and atraumatic.  Cardiovascular:     Rate and Rhythm: Normal rate.  Pulmonary:     Effort: Pulmonary effort is normal. No respiratory distress.  Musculoskeletal:        General: Normal range of motion.     Cervical back: Normal  range of motion.  Neurological:     General: No focal deficit present.     Mental Status: She is alert and oriented to person, place, and time.  Psychiatric:        Mood and Affect: Mood is anxious.        Behavior: Behavior normal.   EFM: 130 bpm, mod variability, + accels, no decels Toco: none  Results for orders placed or performed during the hospital encounter of 05/17/22 (from the past 24 hour(s))  CBC     Status: Abnormal   Collection Time: 05/17/22  6:37 PM  Result Value Ref Range   WBC 8.2 4.0 - 10.5 K/uL   RBC 4.14 3.87 - 5.11 MIL/uL   Hemoglobin 10.8 (L) 12.0 - 15.0 g/dL   HCT 34.1 (L) 36.0 - 46.0 %   MCV 82.4 80.0 - 100.0 fL   MCH 26.1 26.0 - 34.0 pg   MCHC 31.7 30.0 - 36.0 g/dL   RDW 13.5 11.5 - 15.5 %   Platelets 191 150 - 400 K/uL   nRBC 0.0 0.0 - 0.2 %  Comprehensive metabolic panel     Status: Abnormal   Collection Time: 05/17/22  6:37 PM  Result Value Ref Range   Sodium 136 135 - 145 mmol/L   Potassium 3.7 3.5 - 5.1 mmol/L   Chloride 102 98 - 111 mmol/L   CO2 22 22 - 32 mmol/L   Glucose, Bld 95 70 - 99  mg/dL   BUN <5 (L) 6 - 20 mg/dL   Creatinine, Ser 0.49 0.44 - 1.00 mg/dL   Calcium 9.6 8.9 - 10.3 mg/dL   Total Protein 7.0 6.5 - 8.1 g/dL   Albumin 2.7 (L) 3.5 - 5.0 g/dL   AST 15 15 - 41 U/L   ALT 8 0 - 44 U/L   Alkaline Phosphatase 116 38 - 126 U/L   Total Bilirubin 0.4 0.3 - 1.2 mg/dL   GFR, Estimated >60 >60 mL/min   Anion gap 12 5 - 15  Protein / creatinine ratio, urine     Status: Abnormal   Collection Time: 05/17/22  6:56 PM  Result Value Ref Range   Creatinine, Urine 75 mg/dL   Total Protein, Urine 24 mg/dL   Protein Creatinine Ratio 0.32 (H) 0.00 - 0.15 mg/mg[Cre]    MAU Course  Procedures  MDM Prenatal records reviewed. Pregnancy complicated by previous myomectomy, hypothyroidism, IBS and depression.  Labs ordered and reviewed. Meets criteria for PEC, no SF. Consult with Dr. Nelda Marseille, ok for close follow up outpatient. Stable for discharge home.  Assessment and Plan   1. [redacted] weeks gestation of pregnancy   2. NST (non-stress test) reactive   3. Pre-eclampsia in third trimester    Discharge home Follow up at [facility] in 3 days as scheduled Strict return precautions  Allergies as of 05/17/2022   No Known Allergies      Medication List     STOP taking these medications    phenylephrine 10 MG Tabs tablet Commonly known as: SUDAFED PE   progesterone 200 MG capsule Commonly known as: PROMETRIUM       TAKE these medications    acetaminophen 500 MG tablet Commonly known as: TYLENOL Take 500 mg by mouth every 6 (six) hours as needed for moderate pain or headache.   ferrous sulfate 324 MG Tbec Take 324 mg by mouth daily with breakfast.   fluticasone 50 MCG/ACT nasal spray Commonly known as: FLONASE Place into both nostrils.  levothyroxine 88 MCG tablet Commonly known as: SYNTHROID Take 88 mcg by mouth daily before breakfast.   multivitamin-prenatal 27-0.8 MG Tabs tablet Take 1 tablet by mouth daily at 12 noon.   sertraline 25 MG tablet Commonly  known as: ZOLOFT Take 25 mg by mouth daily.        Julianne Handler, CNM 05/17/2022, 9:30 PM

## 2022-05-18 NOTE — Patient Instructions (Addendum)
Amber Kelley  05/18/2022   Your procedure is scheduled on:  05/26/2022  Arrive at 65 at Entrance C on [address] at [facility]  and [address]. You are invited to use the FREE valet parking or use the Visitor's parking deck.  Pick up the phone at the desk and dial [phone].  Call this number if you have problems the morning of surgery: [phone]  Remember:   Do not eat food:(After Midnight) Desps de medianoche.  Do not drink clear liquids: (After Midnight) Desps de medianoche.  Take these medicines the morning of surgery with A SIP OF WATER:  Take levothyroxine and zoloft as prescribed   Do not wear jewelry, make-up or nail polish.  Do not wear lotions, powders, or perfumes. Do not wear deodorant.  Do not shave 48 hours prior to surgery.  Do not bring valuables to the hospital.  [facility] is not   responsible for any belongings or valuables brought to the hospital.  Contacts, dentures or bridgework may not be worn into surgery.  Leave suitcase in the car. After surgery it may be brought to your room.  For patients admitted to the hospital, checkout time is 11:00 AM the day of              discharge.      Please read over the following fact sheets that you were given:     Preparing for Surgery

## 2022-05-18 NOTE — H&P (Incomplete)
HPI: 36 y.o. G2P0010 @ 31w5destimated gestational age (as dated by LMP c/w 7 week ultrasound) presents for primary cesarean delivery due to history of abdominal myomectomy (with transfundal incision). Patient also has developed preeclampsia without severe features over the last few weeks. Declines betamethasone after discussion of risks/benefits.  Leakage of fluid:  No Vaginal bleeding:  No Contractions:  No Fetal movement:  Yes  Prenatal care has been provided by Dr. MWellington Hampshire Tywana Robotham [facility]  ROS:  Denies fevers, chills, chest pain, visual changes, SOB, RUQ/epigastric pain, N/V, dysuria, hematuria, or sudden onset/worsening bilateral LE or facial edema.  Pregnancy complicated by: Preeclampsia without severe features History of abdominal myomectomy (2021) Hypothyroidism (Levothyroxine 849m daily) Depression (Zoloft '50mg'$  daily, follows with counselor) IBS-D (resolved) Uterine fibroids (largest 3.9cm) Abnormal glucola with normal 3h GTT  Prenatal Transfer Tool  Maternal Diabetes: No Genetic Screening: Normal - Panorama low risk female Maternal Ultrasounds/Referrals: Normal Fetal Ultrasounds or other Referrals:  None Maternal Substance Abuse:  No Significant Maternal Medications:  None Significant Maternal Lab Results: {Significant Maternal Lab Results:20235}   Prenatal Labs Blood type:  O Positive Antibody screen:  Negative CBC:  H/H 10.8/34.1 Rubella: Immune RPR:  Non-reactive Hep B:  Negative Hep C:  Negative HIV:  Negative GC/CT:  Negative Glucola:  149 (elevated), 3h GTT passed  Immunizations: Tdap: Given prenatally Flu: Received 9/15  OBHx:  OB History     Gravida  2   Para  0   Term  0   Preterm  0   AB  1   Living  0      SAB  1   IAB      Ectopic      Multiple      Live Births             PMHx:  See above Meds:  PNV Allergy:  No Known Allergies SurgHx: Partial thyroidectomy 2012, Abdominal myomectomy in 2021, Eye  surgery SocHx:   Denies Tobacco, ETOH, illicit drugs  O: LMP 0393/23/5573Gen. AAOx3, NAD CV.  RRR  Resp. CTAB, no wheezes/rales/rhonchi Abd. Gravid, soft, non-tender throughout, no rebound/guarding Extr.  No bilateral LE edema, no calf tenderness bilaterally  Last [place] ***   Labs: see orders  A/P:  [age].o. G2P0010 @ 3668w5do is admitted for cesarean delivery due to history of abdominal myomectomy (with transfundal incision).   Primary cesarean section - Admit to L&D - Admit labs (CBC, T&S, RPR, and COVID screen per protocol) - CEFM/Toco - FWB:  *** - Diet:  NPO - IVF:  Per anesthesia - VTE Prophylaxis:  SCDs - GBS Status:  *** - Presentation:  Cephalic on [place]* - Pain control:  Per patient request - Antibiotics: Ancef 2g on call to OR - Consents signed and witnessed  Preeclampsia without severe features - Medications: None - Preeclampsia labs on admission - PCR 0.3 on 11/27 in MAU  Amber Kelley

## 2022-05-19 ENCOUNTER — Encounter (HOSPITAL_COMMUNITY): Payer: Self-pay

## 2022-05-20 ENCOUNTER — Encounter (HOSPITAL_COMMUNITY): Payer: Self-pay | Admitting: Obstetrics and Gynecology

## 2022-05-20 ENCOUNTER — Inpatient Hospital Stay (HOSPITAL_COMMUNITY)
Admission: AD | Admit: 2022-05-20 | Discharge: 2022-05-23 | DRG: 788 | Disposition: A | Discharge status: Home / Self Care | Payer: Medicaid Other | Attending: Obstetrics and Gynecology | Admitting: Obstetrics and Gynecology

## 2022-05-20 DIAGNOSIS — E039 Hypothyroidism, unspecified: Secondary | ICD-10-CM | POA: Diagnosis present

## 2022-05-20 DIAGNOSIS — D259 Leiomyoma of uterus, unspecified: Secondary | ICD-10-CM | POA: Diagnosis present

## 2022-05-20 DIAGNOSIS — O3413 Maternal care for benign tumor of corpus uteri, third trimester: Secondary | ICD-10-CM | POA: Diagnosis present

## 2022-05-20 DIAGNOSIS — O99344 Other mental disorders complicating childbirth: Secondary | ICD-10-CM | POA: Diagnosis present

## 2022-05-20 DIAGNOSIS — Z3A36 36 weeks gestation of pregnancy: Secondary | ICD-10-CM | POA: Diagnosis not present

## 2022-05-20 DIAGNOSIS — O365930 Maternal care for other known or suspected poor fetal growth, third trimester, not applicable or unspecified: Secondary | ICD-10-CM | POA: Diagnosis present

## 2022-05-20 DIAGNOSIS — F32A Depression, unspecified: Secondary | ICD-10-CM | POA: Diagnosis present

## 2022-05-20 DIAGNOSIS — O99284 Endocrine, nutritional and metabolic diseases complicating childbirth: Secondary | ICD-10-CM | POA: Diagnosis present

## 2022-05-20 DIAGNOSIS — O1404 Mild to moderate pre-eclampsia, complicating childbirth: Secondary | ICD-10-CM | POA: Diagnosis present

## 2022-05-20 DIAGNOSIS — Z9889 Other specified postprocedural states: Principal | ICD-10-CM

## 2022-05-20 DIAGNOSIS — O1494 Unspecified pre-eclampsia, complicating childbirth: Secondary | ICD-10-CM | POA: Diagnosis not present

## 2022-05-20 LAB — COMPREHENSIVE METABOLIC PANEL
ALT: 9 U/L (ref 0–44)
AST: 17 U/L (ref 15–41)
Albumin: 2.8 g/dL — ABNORMAL LOW (ref 3.5–5.0)
Alkaline Phosphatase: 110 U/L (ref 38–126)
Anion gap: 10 (ref 5–15)
BUN: <5 mg/dL — ABNORMAL LOW (ref 6–20)
CO2: 22 mmol/L (ref 22–32)
Calcium: 9 mg/dL (ref 8.9–10.3)
Chloride: 104 mmol/L (ref 98–111)
Creatinine, Ser: 0.55 mg/dL (ref 0.44–1.00)
GFR, Estimated: >60 mL/min (ref >60)
Glucose, Bld: 83 mg/dL (ref 70–99)
Potassium: 3.7 mmol/L (ref 3.5–5.1)
Sodium: 136 mmol/L (ref 135–145)
Total Bilirubin: 0.4 mg/dL (ref 0.3–1.2)
Total Protein: 7 g/dL (ref 6.5–8.1)

## 2022-05-20 LAB — CBC
HCT: 33 % — ABNORMAL LOW (ref 36.0–46.0)
Hemoglobin: 10.7 g/dL — ABNORMAL LOW (ref 12.0–15.0)
MCH: 26.6 pg (ref 26.0–34.0)
MCHC: 32.4 g/dL (ref 30.0–36.0)
MCV: 81.9 fL (ref 80.0–100.0)
Platelets: 187 10*3/uL (ref 150–400)
RBC: 4.03 MIL/uL (ref 3.87–5.11)
RDW: 13.5 % (ref 11.5–15.5)
WBC: 8.1 10*3/uL (ref 4.0–10.5)
nRBC: 0 % (ref 0.0–0.2)

## 2022-05-20 LAB — LACTATE DEHYDROGENASE: LDH: 134 U/L (ref 98–192)

## 2022-05-20 MED ORDER — PHENYLEPHRINE HCL-NACL 20-0.9 MG/250ML-% IV SOLN
INTRAVENOUS | Status: AC
  Filled 2022-05-20: qty 250

## 2022-05-20 MED ORDER — FENTANYL CITRATE (PF) 100 MCG/2ML IJ SOLN
INTRAMUSCULAR | Status: AC
  Filled 2022-05-20: qty 2

## 2022-05-20 MED ORDER — SOD CITRATE-CITRIC ACID 500-334 MG/5ML PO SOLN
30.0000 mL | ORAL | Status: AC
  Administered 2022-05-21: 30 mL via ORAL
  Filled 2022-05-20: qty 30

## 2022-05-20 MED ORDER — CEFAZOLIN SODIUM-DEXTROSE 2-4 GM/100ML-% IV SOLN
2.0000 g | INTRAVENOUS | Status: AC
  Administered 2022-05-21: 2 g via INTRAVENOUS
  Filled 2022-05-20: qty 100

## 2022-05-20 MED ORDER — KETOROLAC TROMETHAMINE 30 MG/ML IJ SOLN
INTRAMUSCULAR | Status: AC
  Filled 2022-05-20: qty 1

## 2022-05-20 MED ORDER — MORPHINE SULFATE (PF) 0.5 MG/ML IJ SOLN
INTRAMUSCULAR | Status: AC
  Filled 2022-05-20: qty 10

## 2022-05-20 MED ORDER — ACETAMINOPHEN 10 MG/ML IV SOLN
INTRAVENOUS | Status: AC
  Filled 2022-05-20: qty 100

## 2022-05-20 NOTE — Progress Notes (Signed)
Primary was originally scheduled for 9pm; however, due to anti-E and anti JKA (Kidd a) antibodies identified on T&S, the blood bank currently has only matched 1u pRBC and it may take several hours to match at least one additional unit. Anesthesia recommends at least 2u pRBCs prior to starting cesarean delivery. FHT has been category I. Patient's Bps are 131-157/85-96. Will plan to perform CS at 0600 on 12/1.  Drema Dallas, DO

## 2022-05-20 NOTE — Anesthesia Preprocedure Evaluation (Addendum)
Anesthesia Evaluation  Patient identified by MRN, date of birth, ID band Patient awake    Reviewed: Allergy & Precautions, NPO status , Patient's Chart, lab work & pertinent test results  History of Anesthesia Complications Negative for: history of anesthetic complications  Airway Mallampati: I  TM Distance: >3 FB Neck ROM: Full    Dental no notable dental hx.    Pulmonary neg pulmonary ROS   Pulmonary exam normal breath sounds clear to auscultation       Cardiovascular hypertension (PreE without SF),  Rhythm:Regular Rate:Normal     Neuro/Psych  PSYCHIATRIC DISORDERS Anxiety     negative neurological ROS     GI/Hepatic negative GI ROS, Neg liver ROS,,,  Endo/Other  Hypothyroidism    Renal/GU negative Renal ROS  negative genitourinary   Musculoskeletal negative musculoskeletal ROS (+)    Abdominal   Peds  Hematology negative hematology ROS (+)   Anesthesia Other Findings   Reproductive/Obstetrics (+) Pregnancy Hx myomectomy From office for severe range Bps (150s/100s), scheduled for section d/t hx myomectomy                              Anesthesia Physical Anesthesia Plan  ASA: 3  Anesthesia Plan: Spinal   Post-op Pain Management: Regional block, Toradol IV (intra-op)* and Ofirmev IV (intra-op)*   Induction:   PONV Risk Score and Plan: 3 and Ondansetron, Dexamethasone and Treatment may vary due to age or medical condition  Airway Management Planned: Natural Airway and Nasal Cannula  Additional Equipment: None  Intra-op Plan:   Post-operative Plan:   Informed Consent: I have reviewed the patients History and Physical, chart, labs and discussed the procedure including the risks, benefits and alternatives for the proposed anesthesia with the patient or authorized representative who has indicated his/her understanding and acceptance.       Plan Discussed with:  Anesthesiologist and CRNA  Anesthesia Plan Comments: (I have discussed risks of neuraxial anesthesia including but not limited to infection, bleeding, nerve injury, back pain, headache, seizures, and failure of block. Patient denies bleeding disorders and is not currently anticoagulated. Labs have been reviewed. Risks and benefits discussed. All patient's questions answered.  )       Anesthesia Quick Evaluation

## 2022-05-20 NOTE — H&P (Signed)
HPI: 36 y.o. G2P0010 @ 70w6destimated gestational age (as dated by LMP c/w 7 week ultrasound) presents for primary cesarean section for preeclampsia without severe features, new onset IUGR 4%ile, BPP 6/8, and history of abdominal myomectomy (transfundal incision). Discussed patient's presentation with MFM, Dr. SDonalee Citrin who agrees with delivery. Patient has previously declined betamethasone for FLM at this stage in her pregnancy.  Leakage of fluid:  No Vaginal bleeding:  No Contractions:  No Fetal movement:  Yes  Prenatal care has been provided by Dr. MWellington Hampshire [facility]smond [facility]  ROS:  Denies fevers, chills, chest pain, visual changes, SOB, RUQ/epigastric pain, N/V, dysuria, hematuria, or sudden onset/worsening bilateral LE or facial edema.  Pregnancy complicated by: Preeclampsia without severe features History of abdominal myomectomy (2021) Hypothyroidism (Levothyroxine 869m daily) Depression (Zoloft '50mg'$  daily, follows with counselor) IBS-D (resolved) Uterine fibroids (largest 3.9cm) Abnormal glucola with normal 3h GTT  Prenatal Transfer Tool  Maternal Diabetes: No Genetic Screening: Normal - Panorama low risk female Maternal Ultrasounds/Referrals: Normal Fetal Ultrasounds or other Referrals:  None Maternal Substance Abuse:  No Significant Maternal Medications:  None Significant Maternal Lab Results: Group B Strep negative   Prenatal Labs Blood type:  O Positive Antibody screen:  Negative CBC:  H/H 10.8/34.1 Rubella: Immune RPR:  Non-reactive Hep B:  Negative Hep C:  Negative HIV:  Negative GC/CT:  Negative Glucola:  149 (elevated), 3h GTT passed  Immunizations: Tdap: Given prenatally Flu: Received 9/15  OBHx:  OB History     Gravida  2   Para  0   Term  0   Preterm  0   AB  1   Living  0      SAB  1   IAB      Ectopic      Multiple      Live Births             PMHx:  See above Meds:  PNV Allergy:   Allergies  Allergen Reactions    Banana Other (See Comments)    Internal pain   SurgHx: Partial thyroidectomy 2012, Abdominal myomectomy in 2021, Eye surgery SocHx:   Denies Tobacco, ETOH, illicit drugs  O: BP 13875/64BP Location: Left Arm)   Pulse 72   Temp 98.1 F (36.7 C) (Oral)   Resp 16   Ht '5\' 2"'$  (1.575 m)   Wt 69.4 kg   LMP 09/11/2021   BMI 27.98 kg/m  Gen. AAOx3, NAD CV.  RRR  Resp. CTAB, no wheezes/rales/rhonchi Abd. Gravid, soft, non-tender throughout, no rebound/guarding Extr.  No bilateral LE edema, no calf tenderness bilaterally  Last US[place]11/30):  3539w6dW 2138 g, 4 lbs 11 oz (4%), AAFV, Cephalic, BPP 6/8 (Off for breathing)   Labs: see orders  A/P:  36 62o. G2P0010 @ 35w45w6d is admitted for cesarean delivery due to history of abdominal myomectomy (with transfundal incision), preeclampsia without severe features, new onset IUGR 4%ile, and BPP 6/8.  Primary cesarean section - Admit to L&D - Admit labs (CBC, T&S, RPR, and COVID screen per protocol) - CEFM/Toco - FWB:  Category I FHT here, BPP 6/8 - Diet:  NPO - IVF:  Per anesthesia - VTE Prophylaxis:  SCDs - GBS Status:  Negative - Presentation:  Cephalic on US  [place] control:  Per patient request - Antibiotics: Ancef 2g on call to OR - Consents signed and witnessed  Preeclampsia without severe features - Medications: None - Preeclampsia labs on admission - PCR 0.3  on 11/27 in MAU  Consents: I have explained to the patient that this surgery is performed to deliver their baby or babies through an incision in the abdomen and incision in the uterus.  Prior to surgery, the risks and benefits of the surgery, as well as alternative treatments were discussed.  The risks include, but are not limited to, possible need for cesarean delivery for all future pregnancies, bleeding at the time of surgery that could necessitate a blood transfusion and/or hysterectomy, rupture of the uterus during a future pregnancy that could cause a preterm  delivery and/or requiring hysterectomy, infection, damage to surrounding organs and tissues, damage to bladder, damage to ureters, causing kidney damage, and requiring additional procedures, damage to bowels, resulting in further surgery, postoperative pain, short-term and long-term, scarring on the abdominal wall and intra-abdominally, need for further surgery, development of an incisional hernia, deep vein thrombosis and/or pulmonary embolism, wound infection and/or separation, painful intercourse, urinary leakage, impact on future pregnancies including but not limited to, abnormal location or attachment of the placenta to the uterus, such as placenta previa or accreta, that may necessitate a blood transfusion and/or hysterectomy, impact on total family size, complications the course of which cannot be predicted or prevented, and death. Patient was consented for blood products.  The patient is aware that bleeding may result in the need for a blood transfusion which includes risk of transmission of HIV (1:2 million), Hepatitis C (1:2 million), and Hepatitis B (1:200 thousand) and transfusion reaction.  Patient voiced understanding of the above risks as well as understanding of indications for blood transfusion.   Drema Dallas, DO

## 2022-05-21 ENCOUNTER — Inpatient Hospital Stay (HOSPITAL_COMMUNITY): Payer: Medicaid Other | Admitting: Anesthesiology

## 2022-05-21 ENCOUNTER — Other Ambulatory Visit: Payer: Self-pay

## 2022-05-21 ENCOUNTER — Encounter (HOSPITAL_COMMUNITY): Payer: Self-pay | Admitting: Obstetrics and Gynecology

## 2022-05-21 ENCOUNTER — Encounter (HOSPITAL_COMMUNITY)
Admission: AD | Disposition: A | Discharge status: Home / Self Care | Payer: Self-pay | Source: Home / Self Care | Attending: Obstetrics and Gynecology

## 2022-05-21 DIAGNOSIS — O365930 Maternal care for other known or suspected poor fetal growth, third trimester, not applicable or unspecified: Secondary | ICD-10-CM

## 2022-05-21 DIAGNOSIS — O1494 Unspecified pre-eclampsia, complicating childbirth: Secondary | ICD-10-CM

## 2022-05-21 DIAGNOSIS — Z3A36 36 weeks gestation of pregnancy: Secondary | ICD-10-CM

## 2022-05-21 DIAGNOSIS — O1404 Mild to moderate pre-eclampsia, complicating childbirth: Secondary | ICD-10-CM

## 2022-05-21 SURGERY — Surgical Case
Anesthesia: Spinal

## 2022-05-21 MED ORDER — AMISULPRIDE (ANTIEMETIC) 5 MG/2ML IV SOLN: 10.0000 mg | Freq: Once | INTRAVENOUS | Status: DC | PRN

## 2022-05-21 MED ORDER — LACTATED RINGERS IV SOLN: INTRAVENOUS | Status: DC | PRN

## 2022-05-21 MED ORDER — MORPHINE SULFATE (PF) 0.5 MG/ML IJ SOLN
INTRAMUSCULAR | Status: DC | PRN
  Administered 2022-05-21: 150 ug via EPIDURAL

## 2022-05-21 MED ORDER — SODIUM CHLORIDE (PF) 0.9 % IJ SOLN
INTRAMUSCULAR | Status: AC
  Filled 2022-05-21: qty 50

## 2022-05-21 MED ORDER — DIBUCAINE (PERIANAL) 1 % EX OINT: 1.0000 | TOPICAL_OINTMENT | CUTANEOUS | Status: DC | PRN

## 2022-05-21 MED ORDER — FENTANYL CITRATE (PF) 100 MCG/2ML IJ SOLN: INTRAMUSCULAR | Status: DC | PRN

## 2022-05-21 MED ORDER — BUPIVACAINE IN DEXTROSE 0.75-8.25 % IT SOLN
INTRATHECAL | Status: DC | PRN
  Administered 2022-05-21: 1.6 mL via INTRATHECAL

## 2022-05-21 MED ORDER — BUPIVACAINE LIPOSOME 1.3 % IJ SUSP
INTRAMUSCULAR | Status: AC
  Filled 2022-05-21: qty 20

## 2022-05-21 MED ORDER — ZOLPIDEM TARTRATE 5 MG PO TABS: 5.0000 mg | ORAL_TABLET | Freq: Every evening | ORAL | Status: DC | PRN

## 2022-05-21 MED ORDER — LACTATED RINGERS IV SOLN: INTRAVENOUS | Status: DC

## 2022-05-21 MED ORDER — OXYCODONE HCL 5 MG/5ML PO SOLN: 5.0000 mg | Freq: Once | ORAL | Status: DC | PRN

## 2022-05-21 MED ORDER — MEPERIDINE HCL 25 MG/ML IJ SOLN: 6.2500 mg | INTRAMUSCULAR | Status: DC | PRN

## 2022-05-21 MED ORDER — SERTRALINE HCL 50 MG PO TABS
50.0000 mg | ORAL_TABLET | Freq: Every day | ORAL | Status: DC
  Administered 2022-05-22 – 2022-05-23 (×2): 50 mg via ORAL
  Filled 2022-05-21 (×2): qty 1

## 2022-05-21 MED ORDER — SODIUM CHLORIDE 0.9% FLUSH: 3.0000 mL | INTRAVENOUS | Status: DC | PRN

## 2022-05-21 MED ORDER — SCOPOLAMINE 1 MG/3DAYS TD PT72
1.0000 | MEDICATED_PATCH | Freq: Once | TRANSDERMAL | Status: DC
  Filled 2022-05-21: qty 1

## 2022-05-21 MED ORDER — BUPIVACAINE HCL 0.25 % IJ SOLN
INTRAMUSCULAR | Status: DC | PRN
  Administered 2022-05-21: 30 mL

## 2022-05-21 MED ORDER — ACETAMINOPHEN 10 MG/ML IV SOLN
INTRAVENOUS | Status: DC | PRN
  Administered 2022-05-21: 1000 mg via INTRAVENOUS

## 2022-05-21 MED ORDER — OXYCODONE HCL 5 MG PO TABS: 5.0000 mg | ORAL_TABLET | ORAL | Status: DC | PRN

## 2022-05-21 MED ORDER — OXYTOCIN-SODIUM CHLORIDE 30-0.9 UT/500ML-% IV SOLN: 2.5000 [IU]/h | INTRAVENOUS | Status: AC

## 2022-05-21 MED ORDER — STERILE WATER FOR IRRIGATION IR SOLN
Status: DC | PRN
  Administered 2022-05-21: 1000 mL

## 2022-05-21 MED ORDER — OXYTOCIN-SODIUM CHLORIDE 30-0.9 UT/500ML-% IV SOLN
INTRAVENOUS | Status: DC | PRN
  Administered 2022-05-21: 30 [IU] via INTRAVENOUS

## 2022-05-21 MED ORDER — SIMETHICONE 80 MG PO CHEW
80.0000 mg | CHEWABLE_TABLET | Freq: Three times a day (TID) | ORAL | Status: DC
  Administered 2022-05-21 – 2022-05-23 (×6): 80 mg via ORAL
  Filled 2022-05-21 (×6): qty 1

## 2022-05-21 MED ORDER — PHENYLEPHRINE 80 MCG/ML (10ML) SYRINGE FOR IV PUSH (FOR BLOOD PRESSURE SUPPORT)
PREFILLED_SYRINGE | INTRAVENOUS | Status: AC
  Filled 2022-05-21: qty 10

## 2022-05-21 MED ORDER — BUPIVACAINE LIPOSOME 1.3 % IJ SUSP
INTRAMUSCULAR | Status: DC | PRN
  Administered 2022-05-21: 20 mL

## 2022-05-21 MED ORDER — SENNOSIDES-DOCUSATE SODIUM 8.6-50 MG PO TABS
2.0000 | ORAL_TABLET | Freq: Every day | ORAL | Status: DC
  Administered 2022-05-23: 2 via ORAL
  Filled 2022-05-21: qty 2

## 2022-05-21 MED ORDER — HYDROMORPHONE HCL 1 MG/ML IJ SOLN: 0.2500 mg | INTRAMUSCULAR | Status: DC | PRN

## 2022-05-21 MED ORDER — ACETAMINOPHEN 10 MG/ML IV SOLN
INTRAVENOUS | Status: AC
  Filled 2022-05-21: qty 100

## 2022-05-21 MED ORDER — IBUPROFEN 600 MG PO TABS
600.0000 mg | ORAL_TABLET | Freq: Four times a day (QID) | ORAL | Status: DC
  Administered 2022-05-22 – 2022-05-23 (×5): 600 mg via ORAL
  Filled 2022-05-21 (×5): qty 1

## 2022-05-21 MED ORDER — KETOROLAC TROMETHAMINE 30 MG/ML IJ SOLN: 30.0000 mg | Freq: Once | INTRAMUSCULAR | Status: DC | PRN

## 2022-05-21 MED ORDER — LEVOTHYROXINE SODIUM 88 MCG PO TABS
88.0000 ug | ORAL_TABLET | Freq: Every day | ORAL | Status: DC
  Administered 2022-05-22 – 2022-05-23 (×2): 88 ug via ORAL
  Filled 2022-05-21 (×3): qty 1

## 2022-05-21 MED ORDER — DIPHENHYDRAMINE HCL 25 MG PO CAPS: 25.0000 mg | ORAL_CAPSULE | ORAL | Status: DC | PRN

## 2022-05-21 MED ORDER — MORPHINE SULFATE (PF) 2 MG/ML IV SOLN: 1.0000 mg | INTRAVENOUS | Status: DC | PRN

## 2022-05-21 MED ORDER — BUPIVACAINE HCL (PF) 0.25 % IJ SOLN
INTRAMUSCULAR | Status: AC
  Filled 2022-05-21: qty 30

## 2022-05-21 MED ORDER — KETOROLAC TROMETHAMINE 30 MG/ML IJ SOLN: 30.0000 mg | Freq: Four times a day (QID) | INTRAMUSCULAR | Status: AC | PRN

## 2022-05-21 MED ORDER — ACETAMINOPHEN 500 MG PO TABS: 1000.0000 mg | ORAL_TABLET | Freq: Four times a day (QID) | ORAL | Status: DC

## 2022-05-21 MED ORDER — DEXAMETHASONE SODIUM PHOSPHATE 4 MG/ML IJ SOLN
INTRAMUSCULAR | Status: AC
  Filled 2022-05-21: qty 1

## 2022-05-21 MED ORDER — PHENYLEPHRINE 80 MCG/ML (10ML) SYRINGE FOR IV PUSH (FOR BLOOD PRESSURE SUPPORT)
PREFILLED_SYRINGE | INTRAVENOUS | Status: DC | PRN
  Administered 2022-05-21: 80 ug via INTRAVENOUS

## 2022-05-21 MED ORDER — ONDANSETRON HCL 4 MG/2ML IJ SOLN: 4.0000 mg | Freq: Once | INTRAMUSCULAR | Status: DC | PRN

## 2022-05-21 MED ORDER — NIFEDIPINE ER OSMOTIC RELEASE 30 MG PO TB24
30.0000 mg | ORAL_TABLET | Freq: Every day | ORAL | Status: DC
  Administered 2022-05-21 – 2022-05-23 (×3): 30 mg via ORAL
  Filled 2022-05-21 (×3): qty 1

## 2022-05-21 MED ORDER — SIMETHICONE 80 MG PO CHEW: 80.0000 mg | CHEWABLE_TABLET | ORAL | Status: DC | PRN

## 2022-05-21 MED ORDER — DEXAMETHASONE SODIUM PHOSPHATE 10 MG/ML IJ SOLN
INTRAMUSCULAR | Status: DC | PRN
  Administered 2022-05-21: 4 mg via INTRAVENOUS

## 2022-05-21 MED ORDER — PHENYLEPHRINE HCL-NACL 20-0.9 MG/250ML-% IV SOLN
INTRAVENOUS | Status: DC | PRN
  Administered 2022-05-21: 60 ug/min via INTRAVENOUS

## 2022-05-21 MED ORDER — NALOXONE HCL 4 MG/10ML IJ SOLN: 1.0000 ug/kg/h | INTRAVENOUS | Status: DC | PRN

## 2022-05-21 MED ORDER — ACETAMINOPHEN 500 MG PO TABS
1000.0000 mg | ORAL_TABLET | Freq: Four times a day (QID) | ORAL | Status: DC
  Administered 2022-05-21 – 2022-05-23 (×7): 1000 mg via ORAL
  Filled 2022-05-21 (×8): qty 2

## 2022-05-21 MED ORDER — KETOROLAC TROMETHAMINE 30 MG/ML IJ SOLN
30.0000 mg | Freq: Four times a day (QID) | INTRAMUSCULAR | Status: AC
  Administered 2022-05-21 – 2022-05-22 (×4): 30 mg via INTRAVENOUS
  Filled 2022-05-21 (×4): qty 1

## 2022-05-21 MED ORDER — OXYCODONE HCL 5 MG PO TABS: 5.0000 mg | ORAL_TABLET | Freq: Once | ORAL | Status: DC | PRN

## 2022-05-21 MED ORDER — ONDANSETRON HCL 4 MG/2ML IJ SOLN: 4.0000 mg | Freq: Three times a day (TID) | INTRAMUSCULAR | Status: DC | PRN

## 2022-05-21 MED ORDER — PRENATAL MULTIVITAMIN CH
1.0000 | ORAL_TABLET | Freq: Every day | ORAL | Status: DC
  Administered 2022-05-23: 1 via ORAL
  Filled 2022-05-21: qty 1

## 2022-05-21 MED ORDER — SODIUM CHLORIDE 0.9 % IR SOLN
Status: DC | PRN
  Administered 2022-05-21: 1000 mL

## 2022-05-21 MED ORDER — DIPHENHYDRAMINE HCL 50 MG/ML IJ SOLN
12.5000 mg | INTRAMUSCULAR | Status: DC | PRN
  Administered 2022-05-21 – 2022-05-22 (×2): 12.5 mg via INTRAVENOUS
  Filled 2022-05-21: qty 1

## 2022-05-21 MED ORDER — WITCH HAZEL-GLYCERIN EX PADS: 1.0000 | MEDICATED_PAD | CUTANEOUS | Status: DC | PRN

## 2022-05-21 MED ORDER — DIPHENHYDRAMINE HCL 50 MG/ML IJ SOLN
INTRAMUSCULAR | Status: AC
  Filled 2022-05-21: qty 1

## 2022-05-21 MED ORDER — FLUTICASONE PROPIONATE 50 MCG/ACT NA SUSP: 1.0000 | Freq: Two times a day (BID) | NASAL | Status: DC | PRN

## 2022-05-21 MED ORDER — ONDANSETRON HCL 4 MG/2ML IJ SOLN
INTRAMUSCULAR | Status: DC | PRN
  Administered 2022-05-21: 4 mg via INTRAVENOUS

## 2022-05-21 MED ORDER — OXYTOCIN-SODIUM CHLORIDE 30-0.9 UT/500ML-% IV SOLN
INTRAVENOUS | Status: AC
  Filled 2022-05-21: qty 500

## 2022-05-21 MED ORDER — FENTANYL CITRATE (PF) 100 MCG/2ML IJ SOLN
INTRAMUSCULAR | Status: DC | PRN
  Administered 2022-05-21: 15 ug via INTRATHECAL

## 2022-05-21 MED ORDER — MENTHOL 3 MG MT LOZG: 1.0000 | LOZENGE | OROMUCOSAL | Status: DC | PRN

## 2022-05-21 MED ORDER — COCONUT OIL OIL: 1.0000 | TOPICAL_OIL | Status: DC | PRN

## 2022-05-21 MED ORDER — FERROUS SULFATE 325 (65 FE) MG PO TABS
325.0000 mg | ORAL_TABLET | ORAL | Status: DC
  Administered 2022-05-23: 325 mg via ORAL
  Filled 2022-05-21: qty 1

## 2022-05-21 MED ORDER — DIPHENHYDRAMINE HCL 25 MG PO CAPS: 25.0000 mg | ORAL_CAPSULE | Freq: Four times a day (QID) | ORAL | Status: DC | PRN

## 2022-05-21 MED ORDER — NALOXONE HCL 0.4 MG/ML IJ SOLN: 0.4000 mg | INTRAMUSCULAR | Status: DC | PRN

## 2022-05-21 MED ORDER — BUPIVACAINE LIPOSOME 1.3 % IJ SUSP: 20.0000 mL | Freq: Once | INTRAMUSCULAR | Status: DC

## 2022-05-21 SURGICAL SUPPLY — 38 items
APL PRP STRL LF DISP 70% ISPRP (MISCELLANEOUS) ×2
APL SKNCLS STERI-STRIP NONHPOA (GAUZE/BANDAGES/DRESSINGS) ×1
BENZOIN TINCTURE PRP APPL 2/3 (GAUZE/BANDAGES/DRESSINGS) ×1 IMPLANT
CHLORAPREP W/TINT 26 (MISCELLANEOUS) ×2 IMPLANT
CLAMP UMBILICAL CORD (MISCELLANEOUS) ×1 IMPLANT
CLOTH BEACON ORANGE TIMEOUT ST (SAFETY) ×1 IMPLANT
DRAPE C SECTION CLR SCREEN (DRAPES) ×1 IMPLANT
DRSG OPSITE POSTOP 4X10 (GAUZE/BANDAGES/DRESSINGS) ×1 IMPLANT
ELECT REM PT RETURN 9FT ADLT (ELECTROSURGICAL) ×1
ELECTRODE REM PT RTRN 9FT ADLT (ELECTROSURGICAL) ×1 IMPLANT
EXTRACTOR VACUUM KIWI (MISCELLANEOUS) IMPLANT
GAUZE SPONGE 4X4 12PLY STRL LF (GAUZE/BANDAGES/DRESSINGS) IMPLANT
GLOVE BIO SURGEON STRL SZ 6.5 (GLOVE) ×1 IMPLANT
GLOVE BIOGEL PI IND STRL 7.0 (GLOVE) ×2 IMPLANT
GLOVE SURG SS PI 6.5 STRL IVOR (GLOVE) ×1 IMPLANT
GOWN STRL REUS W/ TWL LRG LVL3 (GOWN DISPOSABLE) ×3 IMPLANT
GOWN STRL REUS W/TWL LRG LVL3 (GOWN DISPOSABLE) ×3
KIT ABG SYR 3ML LUER SLIP (SYRINGE) IMPLANT
NDL HYPO 25X5/8 SAFETYGLIDE (NEEDLE) IMPLANT
NEEDLE HYPO 25X5/8 SAFETYGLIDE (NEEDLE) IMPLANT
NS IRRIG 1000ML POUR BTL (IV SOLUTION) ×1 IMPLANT
PACK C SECTION WH (CUSTOM PROCEDURE TRAY) ×1 IMPLANT
PAD ABD 7.5X8 STRL (GAUZE/BANDAGES/DRESSINGS) IMPLANT
PAD OB MATERNITY 4.3X12.25 (PERSONAL CARE ITEMS) ×1 IMPLANT
RTRCTR C-SECT PINK 25CM LRG (MISCELLANEOUS) ×1 IMPLANT
STRIP CLOSURE SKIN 1/2X4 (GAUZE/BANDAGES/DRESSINGS) ×1 IMPLANT
SUT MNCRL 0 VIOLET CTX 36 (SUTURE) ×2 IMPLANT
SUT MNCRL+ AB 3-0 CT1 36 (SUTURE) ×2 IMPLANT
SUT MONOCRYL 0 CTX 36 (SUTURE) ×4
SUT MONOCRYL AB 3-0 CT1 36IN (SUTURE) ×2
SUT PDS AB 0 CTX 36 PDP370T (SUTURE) ×2 IMPLANT
SUT PLAIN 0 NONE (SUTURE) IMPLANT
SUT VIC AB 2-0 CT1 27 (SUTURE)
SUT VIC AB 2-0 CT1 TAPERPNT 27 (SUTURE) IMPLANT
SUT VIC AB 4-0 KS 27 (SUTURE) ×1 IMPLANT
TOWEL OR 17X24 6PK STRL BLUE (TOWEL DISPOSABLE) ×2 IMPLANT
TRAY FOLEY W/BAG SLVR 14FR LF (SET/KITS/TRAYS/PACK) ×1 IMPLANT
WATER STERILE IRR 1000ML POUR (IV SOLUTION) ×1 IMPLANT

## 2022-05-21 NOTE — Op Note (Signed)
Pre Op Dx:   1. Single live IUP at 65w0d2. Fetal Growth Restriction 4% 3. BPP 6/8 4. Preeclampsia without severe features 5. History of abdominal myomectomy  Post Op Dx:  Same as pre-operative diagnoses  Procedure:   Low Transverse Cesarean Section  Surgeon:  Dr. MWellington Hampshire DDelora FuelAssistants:  Dr. UVerita SchneidersAnesthesia:  Spinal  EBL:  424cc  IVF:  1000cc UOP:  150cc clear yellow urine  Drains:  Foley catheter  Specimen removed:  Placenta -- sent to pathology Device(s) implanted:  None Case Type:  Clean-contaminated Findings: Uterus with small fibroids throughout that were palpable, anterior ~4cm fibroid noted, omental adhesions to the fundus of the uterus. Omental scar band to the anterior aspect of the uterus. Normal-appearing bilateral fallopian tubes and ovaries. Minimal fascial adhesive disease. Fetus in cephalic position, clear amniotic fluid. APGAR: 8/9. Infant weight: 2090g (4lb 9.7oz). Complications: None  Indications: 36y.o. G2P0010 at 324w0dith new onset IUGR 4%, BPP 6/8, and preeclampsia without severe features. MFM recommended delivery. Due to history of abdominal myomectomy, primary cesarean section performed.  Procedure:  After informed consent was obtained, the patient was brought to the operating room.  Following administration of spinal anesthesia, the patient was positioned in dorsal supine position with a leftward tilt and was prepped and draped in sterile fashion.  A preoperative time-out was performed.  The abdomen was entered in layers through a pfannenstiel incision and a retractor was placed.  A low transverse hysterotomy was created sharply to the level of the membranes, then extended bluntly.  The fetus was delivered from cephalic presentation onto the field.  The cord was doubly clamped and cut after a 60 second pause.  The newborn was passed to the warmer.  The placenta was delivered.  The uterus was swept free of clots and debris and closed in a  running locked fashion with 0-Monocryl. A second imbricating layer was used to close the uterus using 0-Monocryl. There was a small anterior adhesive band between the omentum and the uterus which was separated using electrosurgery and the portion of adhesion connected to the omentum was ligated using 0-Monocryl.  Hemostasis was verified.  The abdomen was irrigated with warmed saline and cleared of clots.  The peritoneum was closed in a running fashion with 2-0 Vicryl.  Subfascial spaces were inspected and hemostasis assured.  The fascia was closed in a running fashion with 0-PDS. Exparel was given beneath the fascia, above the fascia, and in the subcutaneous tissue. The subcutaneous tissues were irrigated and hemostasis assured.  The subcutaneous tissues were closed with 3-0 Monocryl.  The skin was closed with 4-0 Vicryl.  A sterile bandage was applied.  The patient was transferred to PACU.  All needle, sponge, and instrument counts were correct at the end of the case.    Disposition:  PACU  Comments: I performed the procedure and the assistant was needed due to the complexity of the anatomy. An experienced assistant was required given the standard of surgical care given the complexity of the case.  This assistant was needed for exposure, dissection, suctioning, retraction, instrument exchange, assisting with delivery with administration of fundal pressure, and for overall help during the procedure.   MeDrema DallasDO

## 2022-05-21 NOTE — Lactation Note (Signed)
This note was copied from a baby's chart. Lactation Consultation Note  Patient Name: Girl Jnae Thomaston SELTR'V Date: 05/21/2022 Reason for consult: Initial assessment;Primapara;1st time breastfeeding;NICU baby;Infant < 6lbs;Late-preterm 34-36.6wks Age:34 hours  LC in to assist.  P1 Mom of LPTI transferred to NICU for respiratory distress.  Baby currently on CPAP 21%.  Reviewed breast massage and hand expression and encouraged Mom to do this often with pumping.  Assisted Mom with first double pumping using 21 mm flanges.  Mom preferring to sit on bedside.  Mom to pump every 2-3 hrs during the day, and 3-4 hrs at night, the best she can.  Demonstrated initiation setting.  Encouraged STS with baby when able to in the NICU.   Maternal Data Has patient been taught Hand Expression?: Yes Does the patient have breastfeeding experience prior to this delivery?: No  Feeding Mother's Current Feeding Choice: Breast Milk and Donor Milk  Lactation Tools Discussed/Used Tools: Pump;Flanges Flange Size: 21 Breast pump type: Double-Electric Breast Pump Pump Education: Setup, frequency, and cleaning;Milk Storage Reason for Pumping: Support milk supply/LTPI <5 lbs in NICU Pumping frequency: Encouraged every 2-3 hrs during the day and 3-4 hrs at night  Interventions Interventions: DEBP;Education  Discharge Pump: Personal (gift from a friend, Mom thinks its a Sports administrator)  Consult Status Consult Status: Follow-up Date: 05/22/22 Follow-up type: In-patient    Broadus John 05/21/2022, 12:58 PM

## 2022-05-21 NOTE — Transfer of Care (Signed)
Immediate Anesthesia Transfer of Care Note  Patient: Amber Kelley  Procedure(s) Performed: CESAREAN SECTION  Patient Location: PACU  Anesthesia Type:Spinal  Level of Consciousness: awake, alert , and oriented  Airway & Oxygen Therapy: Patient Spontanous Breathing  Post-op Assessment: Report given to RN and Post -op Vital signs reviewed and stable  Post vital signs: Reviewed and stable  Last Vitals:  Vitals Value Taken Time  BP    Temp    Pulse    Resp    SpO2      Last Pain:  Vitals:   05/21/22 0407  TempSrc: Oral  PainSc:          Complications: No notable events documented.

## 2022-05-21 NOTE — Anesthesia Procedure Notes (Signed)
Spinal  Patient location during procedure: OR Start time: 05/21/2022 6:48 AM End time: 05/21/2022 6:50 AM Reason for block: surgical anesthesia Staffing Performed: anesthesiologist  Anesthesiologist: Nilda Simmer, MD Performed by: Nilda Simmer, MD Authorized by: Nilda Simmer, MD   Preanesthetic Checklist Completed: patient identified, IV checked, site marked, risks and benefits discussed, surgical consent, monitors and equipment checked, pre-op evaluation and timeout performed Spinal Block Patient position: sitting Prep: DuraPrep Patient monitoring: blood pressure and continuous pulse ox Approach: midline Location: L3-4 Injection technique: single-shot Needle Needle type: Pencan  Needle gauge: 24 G Needle length: 9 cm Additional Notes Risks and benefits of neuraxial anesthesia including, but not limited to, infection, bleeding, local anesthetic toxicity, headache, hypotension, back pain, block failure, etc. were discussed with the patient. The patient expressed understanding and consented to the procedure. I confirmed that the patient has no bleeding disorders and is not taking blood thinners. I confirmed the patient's last platelet count with the nurse. Monitors were applied. A time-out was performed immediately prior to the procedure. Sterile technique was used throughout the whole procedure.   1 attempt(s)

## 2022-05-21 NOTE — Consult Note (Signed)
Neonatology Note:   Attendance at C-section:    I was asked by Dr. Delora Fuel, MELISSA  to attend this primary C/S delivery at 64w0dfor maternal indications. The mother is a 89yowho is GBS neg with good prenatal care complicated by primary cesarean section for preeclampsia without severe features, new onset IUGR 4%ile, BPP 6/8, and history of abdominal myomectomy (transfundal incision).  Pregnancy complicated by: Preeclampsia without severe features History of abdominal myomectomy (2021) Hypothyroidism (Levothyroxine 883m daily) Depression (Zoloft '50mg'$  daily, follows with counselor) IBS-D (resolved) Uterine fibroids (largest 3.9cm) Abnormal glucola with normal 3h GTT BTMZ had previously declined betamethasone for FLM at this stage in her pregnancy .    ROM 0h 0172mior to delivery, fluid clear. Infant vigorous with good spontaneous cry and tone. +60 sec DCC done.  Needed minimal bulb suctioning. Lungs clear to ausc, good tone and pink in DR.  Weight 2090g.  Apgars 8 at 1 minute, 9 at 5 minutes.  Family updated, with brief discussion about basic care of the late preterm baby.  To MBU in care of Pediatrician.  DavMonia SabalrKatherina MiresD Neonatologist 05/21/2022, 7:48 AM

## 2022-05-22 LAB — CBC
HCT: 25.5 % — ABNORMAL LOW (ref 36.0–46.0)
Hemoglobin: 8.6 g/dL — ABNORMAL LOW (ref 12.0–15.0)
MCH: 27 pg (ref 26.0–34.0)
MCHC: 33.7 g/dL (ref 30.0–36.0)
MCV: 79.9 fL — ABNORMAL LOW (ref 80.0–100.0)
Platelets: 157 10*3/uL (ref 150–400)
RBC: 3.19 MIL/uL — ABNORMAL LOW (ref 3.87–5.11)
RDW: 13.6 % (ref 11.5–15.5)
WBC: 9.2 10*3/uL (ref 4.0–10.5)
nRBC: 0 % (ref 0.0–0.2)

## 2022-05-22 LAB — RPR: RPR Ser Ql: NONREACTIVE

## 2022-05-22 MED ORDER — SODIUM CHLORIDE 0.9 % IV SOLN
500.0000 mg | Freq: Once | INTRAVENOUS | Status: AC
  Administered 2022-05-22: 500 mg via INTRAVENOUS
  Filled 2022-05-22: qty 25

## 2022-05-22 NOTE — Anesthesia Postprocedure Evaluation (Signed)
Anesthesia Post Note  Patient: Amber Kelley  Procedure(s) Performed: CESAREAN SECTION     Patient location during evaluation: PACU Anesthesia Type: Spinal Level of consciousness: awake and alert Pain management: pain level controlled Vital Signs Assessment: post-procedure vital signs reviewed and stable Respiratory status: spontaneous breathing and respiratory function stable Cardiovascular status: blood pressure returned to baseline and stable Postop Assessment: spinal receding Anesthetic complications: no   No notable events documented.  Last Vitals:  Vitals:   05/22/22 0029 05/22/22 0536  BP: (!) 151/90 106/66  Pulse: 74 70  Resp: 16 16  Temp: 36.7 C 36.7 C  SpO2: 99%     Last Pain:  Vitals:   05/22/22 0547  TempSrc:   PainSc: 0-No pain   Pain Goal: Patients Stated Pain Goal: 4 (05/21/22 0815)                 Tiajuana Amass

## 2022-05-22 NOTE — Lactation Note (Signed)
This note was copied from a baby's chart.  NICU Lactation Consultation Note  Patient Name: Amber Kelley AUQJF'H Date: 05/22/2022 Age:34 hours  Subjective Reason for consult: Follow-up assessment; 1st time breastfeeding; Primapara; NICU baby; Late-preterm 34-36.6wks; Infant < 6lbs; Maternal endocrine disorder  Visited with family of 55 hours old LPI NICU female; Amber Kelley is a P1 and reports she has only pumped once. Explained the importance of consistent pumping for the onset of lactogenesis II and to protect her supply; she voiced understanding. She was doing STS with baby when entered the room, praised her for her efforts. Reviewed pumping schedule, lactogenesis II, benefits of STS care and anticipatory guidelines.  Objective Infant data: Mother's Current Feeding Choice: Breast Milk and Donor Milk  Infant feeding assessment Scale for Readiness: 3  Maternal data: G2P0111  C-Section, Low Transverse Significant Breast History:: (+) breast changes during the pregnancy Current breast feeding challenges:: NICU admission Does the patient have breastfeeding experience prior to this delivery?: No Pumping frequency: 1 time/24 hours Pumped volume: 0 mL Flange Size: 21 Risk factor for low milk supply:: primipara, prematurity, hypothyroid, infant separation Pump: Personal (gift from a friend, Mom thinks its a Sports administrator)  Assessment Infant: Feeding Status: NPO  Maternal: Milk volume: Normal  Intervention/Plan Interventions: Breast feeding basics reviewed; DEBP; Education Tools: Pump; Flanges Pump Education: Setup, frequency, and cleaning; Milk Storage  Plan of care: Encouraged pumping every 3 hours,  ideally 8 pumping sessions/24 hours Parents will continue doing as much STS care as they can with baby  FOB present and supportive. All questions and concerns answered, family to contact [facility] services PRN.  Consult Status: NICU follow-up  NICU Follow-up type: Maternal D/C  visit; Verify onset of copious milk; Verify absence of engorgement   Ilayda Toda S Ceirra Belli 05/22/2022, 1:21 PM

## 2022-05-22 NOTE — Progress Notes (Signed)
Subjective: POD# 1 Information for the patient's newborn:  Amber Kelley, Amber Kelley [[id number]]  female   Baby girl in NICU  Reports feeling good, no pain Newborn in NICU for respiratory needs Reports tolerating PO and denies N/V, foley removed, ambulating and urinating w/o difficulty  Pain controlled with  PO meds Denies HA/SOB/dizziness  Flatus passing Vaginal bleeding is normal, no clots     Objective:  VS:  Vitals:   05/21/22 1233 05/21/22 1719 05/21/22 1950 05/22/22 0029  BP:  (!) 138/97 (!) 130/91 (!) 151/90  Pulse:  77 72 74  Resp: '18 16 16 16  '$ Temp:  98.4 F (36.9 C) 98.1 F (36.7 C) 98 F (36.7 C)  TempSrc:  Oral Oral Oral  SpO2: 99% 99% 99% 99%  Weight:      Height:        Intake/Output Summary (Last 24 hours) at 05/22/2022 0438 Last data filed at 05/22/2022 0030 Gross per 24 hour  Intake 2341.55 ml  Output 2525 ml  Net -183.45 ml     Recent Labs    05/20/22 1939  WBC 8.1  HGB 10.7*  HCT 33.0*  PLT 187    Blood type: --/--/O POS (11/30 1939) Rubella: Immune (05/17 0000)    Physical Exam:  General: alert, cooperative, and no distress CV: Regular rate and rhythm or without murmur or extra heart sounds Resp: clear Abdomen: soft, nontender, normal bowel sounds Incision:  pressure dressing remains in place today. Drainage noted on initial honeycomb dressing on PDO #0, new honeycomb and pressure dressing applied.  Current dressing is dry and intact Perineum:  Uterine Fundus: firm, below umbilicus, nontender Lochia: minimal Ext:  no edema, pain, tenderness, or cords   Assessment/Plan: 36 y.o.   POD# 1. [id number]                  Principal Problem:   History of myomectomy   Routine post-op PP care          Advance diet as tolerated Advised warm fluids and ambulation to improve GI motility Breastfeeding support Anticipate D/C on POD 2 or [place], DNP, CNM 05/22/2022, 4:38 AM

## 2022-05-22 NOTE — Progress Notes (Signed)
MOB was referred for history of depression/anxiety. * Referral screened out by Clinical Social Worker because none of the following criteria appear to apply: ~ History of anxiety/depression during this pregnancy, or of post-partum depression following prior delivery. ~ Diagnosis of anxiety and/or depression within last 3 years OR * MOB's symptoms currently being treated with medication and/or therapy. MOB is prescribed Zoloft '50mg'$  daily. Per MOB's pnc records, MOB sees therapist.  Please contact the Clinical Social Worker if needs arise, by [facility] request, or if MOB scores greater than 9/yes to question 10 on [place] Postpartum Depression Screen.  Signed,  Berniece Salines, MSW, LCSWA, LCASA 05/22/2022 9:03 AM

## 2022-05-23 MED ORDER — SENNOSIDES-DOCUSATE SODIUM 8.6-50 MG PO TABS: 2.0000 | ORAL_TABLET | Freq: Every day | ORAL | 0 refills | Status: AC

## 2022-05-23 MED ORDER — NIFEDIPINE ER 30 MG PO TB24: 30.0000 mg | ORAL_TABLET | Freq: Every day | ORAL | 3 refills | Status: DC

## 2022-05-23 MED ORDER — OXYCODONE HCL 5 MG PO TABS: 5.0000 mg | ORAL_TABLET | ORAL | 0 refills | Status: AC | PRN

## 2022-05-23 MED ORDER — FERROUS SULFATE 325 (65 FE) MG PO TABS: 325.0000 mg | ORAL_TABLET | Freq: Every day | ORAL | 11 refills | Status: DC

## 2022-05-23 MED ORDER — IBUPROFEN 600 MG PO TABS: 600.0000 mg | ORAL_TABLET | Freq: Four times a day (QID) | ORAL | 0 refills | Status: AC

## 2022-05-23 NOTE — Progress Notes (Signed)
Post Partum Day 2 S/p PLTCS  Subjective: no complaints, up ad lib, voiding, tolerating PO, and + flatus  Objective: Blood pressure 123/86, pulse 72, temperature 98.1 F (36.7 C), resp. rate 16, height '5\' 2"'$  (1.575 m), weight 69.4 kg, last menstrual period 09/11/2021, SpO2 100 %, unknown if currently breastfeeding.  Physical Exam:  General: alert, cooperative, and no distress Lochia: appropriate Uterine Fundus: firm Incision: healing well, no significant drainage, no dehiscence, no significant erythema, slight clear drainage present DVT Evaluation: No evidence of DVT seen on physical exam.  Recent Labs    05/20/22 1939 05/22/22 0457  HGB 10.7* 8.6*  HCT 33.0* 25.5*    Assessment/Plan: Post Partum Day 2 S/p PLTCS Pre-eclampsia w/o severe features  Discharge home Baby in NICU Pt. desires d/c to home today S/p Iron transfusion 05/22/2022 BP controlled on procardia 30 XL F/u in office in 1 week for BP and incision check   LOS: 3 days   Josefine Class, MD 05/23/2022, 11:35 AM

## 2022-05-23 NOTE — Discharge Summary (Addendum)
Obstetric Discharge Summary Reason for Admission: cesarean section Prenatal Procedures: none Intrapartum Procedures: cesarean: low cervical, transverse Postpartum Procedures: transfusion iron Complications-Operative and Postpartum: none Hemoglobin  Date Value Ref Range Status  05/22/2022 8.6 (L) 12.0 - 15.0 g/dL Final   HCT  Date Value Ref Range Status  05/22/2022 25.5 (L) 36.0 - 46.0 % Final    Physical Exam:  General: alert, cooperative, and no distress Lochia: appropriate Uterine Fundus: firm Incision: healing well, no significant drainage, no dehiscence, no significant erythema, slight clear drainage present DVT Evaluation: No evidence of DVT seen on physical exam.  Discharge Diagnoses: Preelampsia  Discharge Information: Date: 05/23/2022 Activity: pelvic rest Diet: routine Medications: Ibuprofen and oxycodone and senokot and procardia and iron Condition: stable Instructions: refer to practice specific booklet Discharge to: home  Follow-up Information     Drema Dallas, DO. Schedule an appointment as soon as possible for a visit in 1 week(s).   Specialty: Obstetrics and Gynecology Why: BP and incision check Contact information: [place] [address] [phone]                 Newborn Data: Live born female  Birth Weight: 4 lb 9.7 oz (2090 g) APGAR: 71, 9  Newborn Delivery   Birth date/time: 05/21/2022 07:14:00 Delivery type: C-Section, Low Transverse C-section categorization: Primary     Newborn in NICU  Josefine Class 05/23/2022, 11:55 AM

## 2022-05-24 ENCOUNTER — Inpatient Hospital Stay (HOSPITAL_COMMUNITY)
Admission: AD | Admit: 2022-05-24 | Discharge: 2022-05-24 | Disposition: A | Discharge status: Home / Self Care | Payer: Medicaid Other | Attending: Obstetrics and Gynecology | Admitting: Obstetrics and Gynecology

## 2022-05-24 ENCOUNTER — Ambulatory Visit: Payer: Self-pay

## 2022-05-24 ENCOUNTER — Inpatient Hospital Stay (HOSPITAL_COMMUNITY)
Admission: RE | Admit: 2022-05-24 | Discharge: 2022-05-24 | Disposition: A | Discharge status: Home / Self Care | Payer: Medicaid Other | Source: Ambulatory Visit

## 2022-05-24 DIAGNOSIS — R52 Pain, unspecified: Secondary | ICD-10-CM | POA: Insufficient documentation

## 2022-05-24 DIAGNOSIS — T801XXA Vascular complications following infusion, transfusion and therapeutic injection, initial encounter: Secondary | ICD-10-CM

## 2022-05-24 DIAGNOSIS — Z9889 Other specified postprocedural states: Secondary | ICD-10-CM

## 2022-05-24 DIAGNOSIS — Y828 Other medical devices associated with adverse incidents: Secondary | ICD-10-CM | POA: Diagnosis not present

## 2022-05-24 DIAGNOSIS — M7989 Other specified soft tissue disorders: Secondary | ICD-10-CM | POA: Insufficient documentation

## 2022-05-24 HISTORY — DX: Benign neoplasm of connective and other soft tissue, unspecified: D21.9

## 2022-05-24 HISTORY — DX: Anxiety disorder, unspecified: F41.9

## 2022-05-24 HISTORY — DX: Unspecified pre-eclampsia, unspecified trimester: O14.90

## 2022-05-24 HISTORY — DX: Gestational (pregnancy-induced) hypertension without significant proteinuria, unspecified trimester: O13.9

## 2022-05-24 LAB — BPAM RBC
Blood Product Expiration Date: 202312182359
Blood Product Expiration Date: 202312272359
Blood Product Expiration Date: 202312312359
Blood Product Expiration Date: 202401022359
Blood Product Expiration Date: 202401052359
Blood Product Expiration Date: 202401052359
Blood Product Expiration Date: 202401052359
ISSUE DATE / TIME: 202312011005
Unit Type and Rh: 5100
Unit Type and Rh: 5100
Unit Type and Rh: 5100
Unit Type and Rh: 5100
Unit Type and Rh: 5100
Unit Type and Rh: 5100
Unit Type and Rh: 5100

## 2022-05-24 LAB — TYPE AND SCREEN
ABO/RH(D): O POS
Antibody Screen: POSITIVE
Donor AG Type: NEGATIVE
Donor AG Type: NEGATIVE
Donor AG Type: NEGATIVE
Donor AG Type: NEGATIVE
Donor AG Type: NEGATIVE
Donor AG Type: NEGATIVE
Donor AG Type: NEGATIVE
Unit division: 0
Unit division: 0
Unit division: 0
Unit division: 0
Unit division: 0
Unit division: 0
Unit division: 0

## 2022-05-24 LAB — SURGICAL PATHOLOGY

## 2022-05-24 NOTE — MAU Provider Note (Signed)
History     CSN: [id number]  Arrival date and time: 05/24/22 7253   Event Date/Time   First Provider Initiated Contact with Patient 05/24/22 1920      No chief complaint on file.  36 y.o. G2P0111 s/p CS 3 days ago presenting for pain at IV site and blood on dressing. Reports pain and swelling after her IV was removed yesterday. Reports swelling is spreading up her forearm. Denies redness or drainage. Denies fever. Reports seeing bright red blood on her honeycomb dressing today. Denies increased activity or injury. Pain is well controlled with Tylenol and Ibuprofen. Lochia is spotting.      OB History     Gravida  2   Para  1   Term  0   Preterm  1   AB  1   Living  1      SAB  1   IAB      Ectopic      Multiple  0   Live Births  1           Past Medical History:  Diagnosis Date   Anxiety    Fibroid    Hypothyroidism    Keratoconus of both eyes    Preeclampsia    Pregnancy induced hypertension     Past Surgical History:  Procedure Laterality Date   CESAREAN SECTION N/A 05/21/2022   Procedure: CESAREAN SECTION;  Surgeon: Drema Dallas, DO;  Location: [facility];  Service: Obstetrics;  Laterality: N/A;   EYE SURGERY Bilateral 2019   per patient "CORNEAL CROSS-LINKING"   MYOMECTOMY N/A 05/07/2020   Procedure: ABDOMINAL MYOMECTOMY;  Surgeon: Drema Dallas, DO;  Location: [facility];  Service: Gynecology;  Laterality: N/A;   THYROIDECTOMY, PARTIAL  2012    Family History  Family history unknown: Yes    Social History   Tobacco Use   Smoking status: Never   Smokeless tobacco: Never  Vaping Use   Vaping Use: Never used  Substance Use Topics   Alcohol use: Not Currently    Comment: rarely   Drug use: Never    Allergies:  Allergies  Allergen Reactions   Banana Other (See Comments)    Internal pain    Medications Prior to Admission  Medication Sig Dispense Refill Last Dose   acetaminophen (TYLENOL) 500 MG tablet Take 500 mg by mouth  every 6 (six) hours as needed for moderate pain or headache.      ferrous sulfate 324 MG TBEC Take 324 mg by mouth daily with breakfast.      ferrous sulfate 325 (65 FE) MG tablet Take 1 tablet (325 mg total) by mouth daily. 30 tablet 11    fluticasone (FLONASE) 50 MCG/ACT nasal spray Place into both nostrils.      ibuprofen (ADVIL) 600 MG tablet Take 1 tablet (600 mg total) by mouth every 6 (six) hours for 10 days. 40 tablet 0    levothyroxine (SYNTHROID) 88 MCG tablet Take 88 mcg by mouth daily before breakfast.       NIFEdipine (ADALAT CC) 30 MG 24 hr tablet Take 1 tablet (30 mg total) by mouth daily. 30 tablet 3    oxyCODONE (OXY IR/ROXICODONE) 5 MG immediate release tablet Take 1 tablet (5 mg total) by mouth every 4 (four) hours as needed for up to 3 days for moderate pain. 18 tablet 0    Prenatal Vit-Fe Fumarate-FA (MULTIVITAMIN-PRENATAL) 27-0.8 MG TABS tablet Take 1 tablet by mouth daily at 12 noon.  senna-docusate (SENOKOT-S) 8.6-50 MG tablet Take 2 tablets by mouth daily for 10 days. 20 tablet 0    sertraline (ZOLOFT) 25 MG tablet Take 50 mg by mouth daily.       Review of Systems  Constitutional:  Negative for chills and fever.  Gastrointestinal:  Negative for abdominal pain.  Genitourinary:  Positive for vaginal bleeding.   Physical Exam   Blood pressure 133/88, pulse 86, temperature 98.2 F (36.8 C), temperature source Oral, resp. rate 18, height '5\' 2"'$  (1.575 m), weight 65.5 kg, last menstrual period 09/11/2021, SpO2 100 %, currently breastfeeding.  Physical Exam Vitals and nursing note reviewed.  Constitutional:      General: She is not in acute distress.    Appearance: Normal appearance.  HENT:     Head: Normocephalic and atraumatic.  Cardiovascular:     Rate and Rhythm: Normal rate.  Pulmonary:     Effort: Pulmonary effort is normal. No respiratory distress.  Abdominal:     Palpations: Abdomen is soft.     Tenderness: There is no abdominal tenderness.   Musculoskeletal:        General: Normal range of motion.     Cervical back: Normal range of motion.  Skin:    General: Skin is warm and dry.     Comments: Trace edema at IV site (Rt forearm) and just superior, TTP, no erythema or drainage. Bright and dark red blood stained on lower portion of honeycomb dressing. Dsg removed, incision well approximated with ss and no active bleeding. New honeycomb dressing applied.   Neurological:     General: No focal deficit present.     Mental Status: She is alert and oriented to person, place, and time.  Psychiatric:        Mood and Affect: Mood normal.        Behavior: Behavior normal.    No results found for this or any previous visit (from the past 24 hour(s)).   MAU Course  Procedures  MDM No signs of infection at IV site. Recommend elevation and ice. No active bleeding at incision, informed pt to notify MD or return if bleeding recurs. Stable for discharge home.   Assessment and Plan   1. Postoperative state   2. Intravenous infiltration, initial encounter    Discharge home Follow up at [facility] this week as scheduled Return precautions  Allergies as of 05/24/2022       Reactions   Banana Other (See Comments)   Internal pain        Medication List     TAKE these medications    acetaminophen 500 MG tablet Commonly known as: TYLENOL Take 500 mg by mouth every 6 (six) hours as needed for moderate pain or headache.   ferrous sulfate 324 MG Tbec Take 324 mg by mouth daily with breakfast.   ferrous sulfate 325 (65 FE) MG tablet Take 1 tablet (325 mg total) by mouth daily.   fluticasone 50 MCG/ACT nasal spray Commonly known as: FLONASE Place into both nostrils.   ibuprofen 600 MG tablet Commonly known as: ADVIL Take 1 tablet (600 mg total) by mouth every 6 (six) hours for 10 days.   levothyroxine 88 MCG tablet Commonly known as: SYNTHROID Take 88 mcg by mouth daily before breakfast.   multivitamin-prenatal  27-0.8 MG Tabs tablet Take 1 tablet by mouth daily at 12 noon.   NIFEdipine 30 MG 24 hr tablet Commonly known as: ADALAT CC Take 1 tablet (30 mg total) by  mouth daily.   oxyCODONE 5 MG immediate release tablet Commonly known as: Oxy IR/ROXICODONE Take 1 tablet (5 mg total) by mouth every 4 (four) hours as needed for up to 3 days for moderate pain.   senna-docusate 8.6-50 MG tablet Commonly known as: Senokot-S Take 2 tablets by mouth daily for 10 days.   sertraline 25 MG tablet Commonly known as: ZOLOFT Take 50 mg by mouth daily.         Julianne Handler, CNM 05/24/2022, 7:49 PM

## 2022-05-24 NOTE — Lactation Note (Signed)
This note was copied from a baby's chart.  NICU Lactation Consultation Note  Patient Name: Amber Kelley DEYCX'K Date: 05/24/2022 Age:34 hours   Subjective Reason for consult: Follow-up assessment Mother has only pumped 3x since delivery. We reviewed pumping strategies that align with her goals. Mother is aware of best-practices for pumping.  Objective Infant data: Mother's Current Feeding Choice: Breast Milk and Donor Milk  Infant feeding assessment Scale for Readiness: 3     Maternal data: G2P0111  C-Section, Low Transverse  Pumping frequency: 3 x since delivery  Risk factor for low milk supply:: infrequent pumping   Pump: Personal (gift from a friend, Mom thinks its a Spectra)    Intervention/Plan Interventions: Education  Plan: Consult Status: NICU follow-up  NICU Follow-up type: Weekly NICU follow up    Gwynne Edinger 05/24/2022, 10:59 AM

## 2022-05-24 NOTE — MAU Note (Signed)
Amber Kelley is a 36 y.o. at 56w0dhere in MAU reporting: c/s 12/1, was discharged 12/3.  IV removed before d/c.  Now has an ~3in area above the site that is edematous, firm, tender.  Is throbbing and painful and getting worse.  Also reports bleeding on honeycomb.  Did not call DR, has been in NICU with baby.  Onset of complaint: noted tenderness and firmness when removed.  Pain score: 4 Vitals:   05/24/22 1849  BP: 133/88  Pulse: 86  Resp: 18  Temp: 98.2 F (36.8 C)  SpO2: 100%      Lab orders placed from triage:  none

## 2022-05-26 ENCOUNTER — Encounter (HOSPITAL_COMMUNITY): Admission: RE | Payer: Self-pay | Source: Home / Self Care

## 2022-05-26 ENCOUNTER — Ambulatory Visit: Payer: Self-pay

## 2022-05-26 ENCOUNTER — Inpatient Hospital Stay (HOSPITAL_COMMUNITY)
Admission: RE | Admit: 2022-05-26 | Payer: Medicaid Other | Source: Home / Self Care | Admitting: Obstetrics and Gynecology

## 2022-05-26 SURGERY — Surgical Case
Anesthesia: Choice

## 2022-05-26 NOTE — Lactation Note (Signed)
This note was copied from a baby's chart.  NICU Lactation Consultation Note  Patient Name: Girl Beyonce Sawatzky VOZDG'U Date: 05/26/2022 Age:34 days  Subjective Reason for consult: Follow-up assessment; Maternal endocrine disorder; NICU baby; Late-preterm 34-36.6wks; 1st time breastfeeding; Primapara; Infant < 6lbs  Visited with family of 43 67/61 weeks old (adjusted) NICU female, Ms. Firkus is a P1 and reports she's pumping but still not consistently. Explained the importance of consistent pumping for the prevention of engorgement and to protect her supply. She voiced her breast feel heavy and "lumpy" no S/S of engorgement upon breast examination; breast are filling in with some knots on R side on the upper and lower R quadrant. Provided ice packs and a hands free pumping top in size S/M; she was very appreciative. Reviewed pumping schedule, pump settings, lactogenesis II/III, IDF 1/2 and anticipatory guidelines.  Objective Infant data: Mother's Current Feeding Choice: Breast Milk and Donor Milk  Infant feeding assessment Scale for Readiness: 3  Maternal data: G2P0111  C-Section, Low Transverse Pumping frequency: 5 times/24 hours Pumped volume: 60 mL Flange Size: 21 Risk factor for low milk supply:: primipara, prematurity, infant separation, hypothyroid, infrequent pumping Pump: Personal (gift from a friend, Mom thinks its a Spectra)  Assessment Infant: In NICU  Maternal: Milk volume: Normal  Intervention/Plan Interventions: Breast feeding basics reviewed; DEBP; Education; Infant Driven Feeding Algorithm education; Ice Tools: Pump; Flanges; Coconut oil; Hands-free pumping top (Size S/M) Pump Education: Setup, frequency, and cleaning; Milk Storage  Plan of care: Encouraged pumping every 3 hours,  ideally 8 pumping sessions/24 hours She'll switch her pump settings from initiation to expression mode She'll continue icing her breasts as needed She'll start taking baby to an  empty breast once baby is ready and will call for assistance PRN   No other support person at this time. All questions and concerns answered, family to contact [facility] services PRN.  Consult Status: NICU follow-up  NICU Follow-up type: Weekly NICU follow up; Assist with IDF-1 (Mother to pre-pump before breastfeeding)   Rudie Rikard Francene Boyers 05/26/2022, 1:43 PM

## 2022-05-29 ENCOUNTER — Telehealth (HOSPITAL_COMMUNITY): Payer: Self-pay | Admitting: *Deleted

## 2022-05-29 NOTE — Telephone Encounter (Signed)
Patient voiced no questions or concerns regarding her health at this time. EPDS=6. Infant remains in NICU. Patient requested RN email information on hospital's virtual postpartum classes and support groups. Email sent. Erline Levine, RN, 05/29/22, [phone]

## 2022-05-31 ENCOUNTER — Inpatient Hospital Stay (HOSPITAL_COMMUNITY): Admission: RE | Admit: 2022-05-31 | Payer: Medicaid Other | Source: Ambulatory Visit

## 2022-06-01 ENCOUNTER — Ambulatory Visit: Payer: Self-pay

## 2022-06-01 NOTE — Lactation Note (Signed)
This note was copied from a baby's chart.  NICU Lactation Consultation Note  Patient Name: Girl Tashima Scarpulla DPTEL'M Date: 06/01/2022 Age:34 days  Subjective Reason for consult: Follow-up assessment; Maternal endocrine disorder; NICU baby; 1st time breastfeeding; Primapara; Infant < 6lbs; Early term 37-38.6wks; Exclusive pumping and bottle feeding  Visited with family of 65 days old ETI NICU female; Ms. Luera is a P1 and reports pumping is going well, she's doing it every 3 hours, although skipping a couple of pumping sessions at night. She voiced that she's been also working on "hands on pumping" and the knots on her breasts are gone; her supply has also increased; praised her for her efforts. 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 Asked her to call for assistance with latching and positioning if she changes her mind. Reviewed strategies to protect her supply, lactogenesis III, and the importance of not going more than 4-6 hours without pumping at night. Provided handout for supply guidelines per her request.  Objective Infant data: Mother's Current Feeding Choice: Breast Milk and Donor Milk  Infant feeding assessment Scale for Readiness: 2 Scale for Quality: -- (sleepy after transitioning from no-flow to DBUP)  Maternal data: G2P0111  C-Section, Low Transverse Pumping frequency: 6 times/24 hours Pumped volume: 160 mL (160-180 ml.) Flange Size: 21 Pump: Personal (gift from a friend, Mom thinks its a Sports administrator)  Assessment Infant: In NICU  Maternal: Milk volume: Normal  Intervention/Plan Interventions: Breast feeding basics reviewed; DEBP; Education Tools: Pump; Flanges; Hands-free pumping top; Coconut oil (Size S/M) Pump Education: Setup, frequency, and cleaning; Milk Storage  Plan of care: Encouraged pumping every 3 hours,  ideally 8 pumping sessions/24  hours She'll start power pumping in the AM   No other support person at this time. All questions and concerns answered, family to contact [facility] services PRN.  Consult Status: NICU follow-up  NICU Follow-up type: Weekly NICU follow up   [place] 06/01/2022, 12:01 PM

## 2022-08-12 ENCOUNTER — Encounter: Payer: Self-pay | Admitting: Physical Therapy

## 2022-08-12 ENCOUNTER — Ambulatory Visit: Payer: Medicaid Other | Attending: Obstetrics and Gynecology | Admitting: Physical Therapy

## 2022-08-12 ENCOUNTER — Other Ambulatory Visit: Payer: Self-pay

## 2022-08-12 DIAGNOSIS — M62838 Other muscle spasm: Secondary | ICD-10-CM | POA: Diagnosis present

## 2022-08-12 DIAGNOSIS — R293 Abnormal posture: Secondary | ICD-10-CM | POA: Insufficient documentation

## 2022-08-12 DIAGNOSIS — M6281 Muscle weakness (generalized): Secondary | ICD-10-CM

## 2022-08-12 DIAGNOSIS — M5459 Other low back pain: Secondary | ICD-10-CM | POA: Insufficient documentation

## 2022-08-12 NOTE — Therapy (Signed)
OUTPATIENT PHYSICAL THERAPY FEMALE PELVIC EVALUATION   Patient Name: Amber Kelley MRN: [id number] DOB:05-07-1986, 37 y.o., female Today's Date: 08/12/2022  END OF SESSION:  PT End of Session - 08/12/22 1410     Visit Number 1    Date for PT Re-Evaluation 11/04/22    Authorization Type [employer]    PT Start Time 1102    PT Stop Time 1143    PT Time Calculation (min) 41 min    Activity Tolerance Patient tolerated treatment well    Behavior During Therapy WFL for tasks assessed/performed             Past Medical History:  Diagnosis Date   Anxiety    Fibroid    Hypothyroidism    Keratoconus of both eyes    Preeclampsia    Pregnancy induced hypertension    Past Surgical History:  Procedure Laterality Date   CESAREAN SECTION N/A 05/21/2022   Procedure: CESAREAN SECTION;  Surgeon: Drema Dallas, DO;  Location: [facility];  Service: Obstetrics;  Laterality: N/A;   EYE SURGERY Bilateral 2019   per patient "CORNEAL CROSS-LINKING"   MYOMECTOMY N/A 05/07/2020   Procedure: ABDOMINAL MYOMECTOMY;  Surgeon: Drema Dallas, DO;  Location: [facility];  Service: Gynecology;  Laterality: N/A;   THYROIDECTOMY, PARTIAL  2012   Patient Active Problem List   Diagnosis Date Noted   History of myomectomy 05/20/2022   Fibroid uterus 05/02/2020   Hypothyroidism due to Hashimoto's thyroiditis 10/13/2017   Irritable bowel syndrome with diarrhea 10/13/2017   Allergic conjunctivitis of both eyes 07/26/2017   Myopia of both eyes with astigmatism 07/26/2017   Unstable keratoconus of both eyes 07/26/2017    PCP: [facility]  REFERRING PROVIDER: Drema Dallas, DO   REFERRING DIAG: N81.89 (ICD-10-CM) - Other female genital prolapse   THERAPY DIAG:  Other low back pain  Muscle weakness (generalized)  Other muscle spasm  Abnormal posture  Rationale for Evaluation and Treatment: Rehabilitation  ONSET DATE: after c-section 05/21/2022 things got worse, but had some things before  pregnancy  SUBJECTIVE:                                                                                                                                                                                           SUBJECTIVE STATEMENT: Both hips and low back pain. When I cough there is leakage.  Feeling like cannot do certain activities such as exercise.  Not breast feeding Fluid intake: Yes: 30 oz/day    PAIN:  Are you having pain? Yes NPRS scale: 7/10 at worst, typical day 5/10 Pain location:  low back and hip joints  Pain  type: aching Pain description: intermittent   Aggravating factors: sitting on the ground to standing it feels like there is grinding in the hips Relieving factors: walking around feels better in the hips, back always hurts  PRECAUTIONS: None  WEIGHT BEARING RESTRICTIONS: No  FALLS:  Has patient fallen in last 6 months? No  LIVING ENVIRONMENT: Lives with: lives with their spouse and 4 children Lives in: House/apartment   OCCUPATION: in school   PLOF: Independent  PATIENT GOALS: get rid of pain and be able to work out with leaking  PERTINENT HISTORY:  C-section recent Sexual abuse: No  BOWEL MOVEMENT: Pain with bowel movement: No Type of bowel movement:Type (Bristol Stool Scale) normal, Frequency normal, and Strain Yes every so often with digestive issues - maybe 1/month Fully empty rectum: Yes:   Leakage: No Pads: No Fiber supplement: No  URINATION: Pain with urination: No Fully empty bladder: Yes:   Stream: Strong Urgency: No Frequency: normal Leakage: Coughing, Exercise, and when bladder is full Pads: Yes: 2 pads/day  INTERCOURSE: Pain with intercourse: Initial Penetration and During Penetration Ability to have vaginal penetration:  Yes: but haven't really had intercourse since postpartum  Marinoff Scale: 3/3  PREGNANCY:  C-section deliveries 1 (3 other children not biological) Currently pregnant No  PROLAPSE: None   OBJECTIVE:    DIAGNOSTIC FINDINGS:    PATIENT SURVEYS:    PFIQ-7   COGNITION: Overall cognitive status: Within functional limits for tasks assessed     SENSATION: Light touch:  Proprioception:   MUSCLE LENGTH: Hamstrings: Right 80 deg; Left 70 deg Thomas test:  LUMBAR SPECIAL TESTS:  ASLR  FUNCTIONAL TESTS:  Single leg normal  GAIT:  Comments: pt was carrying baby in car seat  POSTURE: rounded shoulders, increased lumbar lordosis, increased thoracic kyphosis, and anterior pelvic tilt  PELVIC ALIGNMENT: left anterior rotation  LUMBARAROM/PROM:  A/PROM A/PROM  eval  Flexion WFL some pain when leaning fwd  Extension   Right lateral flexion WFL   Left lateral flexion WFL   Right rotation   Left rotation    (Blank rows = not tested)  LOWER EXTREMITY ROM:  P/ROM Right eval Left eval  Hip flexion 85% pain lateral hip 75% pain groin  Hip extension    Hip abduction    Hip adduction    Hip internal rotation WFL pain lateral hip 75% pain groin  Hip external rotation 50%   Knee flexion    Knee extension    Ankle dorsiflexion    Ankle plantarflexion    Ankle inversion    Ankle eversion     (Blank rows = not tested)  LOWER EXTREMITY MMT:  MMT Right eval Left eval  Hip flexion    Hip extension    Hip abduction 4-/5 4-/5  Hip adduction 4-/5 4-/5  Hip internal rotation    Hip external rotation    Knee flexion    Knee extension    Ankle dorsiflexion    Ankle plantarflexion    Ankle inversion    Ankle eversion     PALPATION:   General  tight Rt TFL and glutes, tight lumbar paraspinals                External Perineal Exam external muscle attachments tender to palpation                             Internal Pelvic Floor Rt side more inflamed, bil levators and obturators  tight and tender to palpation but Rt side more   Patient confirms identification and approves PT to assess internal pelvic floor and treatment Yes  PELVIC MMT:   MMT eval  Vaginal 3/5, 5  reps, 1 x10sec hold  Internal Anal Sphincter   External Anal Sphincter   Puborectalis   Diastasis Recti 1-2 fingers at and above umbilicus  (Blank rows = not tested)        TONE: high  PROLAPSE: no  TODAY'S TREATMENT:                                                                                                                              DATE: 08/12/22  EVAL and scar massage and moisturizing coconut oil and vitamin e education and handout   PATIENT EDUCATION:  Education details: self massage Person educated: Patient Education method: Consulting civil engineer, Media planner, Verbal cues, and Handouts Education comprehension: verbalized understanding  HOME EXERCISE PROGRAM: Not given today  ASSESSMENT:  CLINICAL IMPRESSION: Patient is a 37 y.o. female who was seen today for physical therapy evaluation and treatment for pelvic and back pain. No observed prolapse noted today.  Pt does have tight and tender pelvic floor muscles, weakness of abdominal muscles with some diastasis.  Pt has weakness in bilateral hips.  Pt is also tight with limited ROM in bil hips.  Poster abnormalities that appears to be pregnancy related.  Pt will benefit from skilled PT to address all of the above impairments for improved function and pain management  OBJECTIVE IMPAIRMENTS: decreased coordination, decreased endurance, decreased ROM, decreased strength, increased fascial restrictions, increased muscle spasms, impaired flexibility, impaired tone, postural dysfunction, and pain.   ACTIVITY LIMITATIONS: lifting, bending, squatting, toileting, caring for others, and sitting on the floor, exercises  PARTICIPATION LIMITATIONS: interpersonal relationship and community activity  PERSONAL FACTORS: 1 comorbidity: c-section and chronic pain that is exacerbated since pregnancy  are also affecting patient's functional outcome.   REHAB POTENTIAL: Excellent  CLINICAL DECISION MAKING: Evolving/moderate  complexity  EVALUATION COMPLEXITY: Moderate   GOALS: Goals reviewed with patient? Yes  SHORT TERM GOALS: Target date: 09/09/22  Ind with scar massage Baseline: Goal status: INITIAL  2.  Ind with transversus abdominus activation on exhale Baseline:  Goal status: INITIAL  3.  Pt will report 25% less pain Baseline:  Goal status: INITIAL    LONG TERM GOALS: Target date: 11/04/22  Pt will report 80% reduction of pain due to improvements in posture, strength, and muscle length  Baseline:  Goal status: INITIAL  2.  Pt will be independent with advanced HEP to maintain improvements made throughout therapy  Baseline:  Goal status: INITIAL  3.  Pt will have 1-2/3 score of Marinoff scale  Baseline: 3/3 Goal status: INITIAL  4.  Pt will be able to correctly activate their core and lift 20 lb x 5 reps Baseline:  Goal status: INITIAL  5.  Pt will be able to sit on the ground without  increased pain Baseline:  Goal status: INITIAL   PLAN:  PT FREQUENCY: 1x/week  PT DURATION: 12 weeks  PLANNED INTERVENTIONS: Therapeutic exercises, Therapeutic activity, Neuromuscular re-education, Balance training, Gait training, Patient/Family education, Self Care, Joint mobilization, Aquatic Therapy, Dry Needling, Electrical stimulation, Cryotherapy, Moist heat, Taping, Biofeedback, Manual therapy, and Re-evaluation  PLAN FOR NEXT SESSION: transversus abdominus activation, pressure management, lumbar STM and cupping to back and c-section incision   Camillo Flaming Raji Glinski, PT 08/12/2022, 2:21 PM

## 2022-08-19 ENCOUNTER — Ambulatory Visit: Payer: Medicaid Other | Admitting: Physical Therapy

## 2022-08-19 DIAGNOSIS — M5459 Other low back pain: Secondary | ICD-10-CM

## 2022-08-19 DIAGNOSIS — R293 Abnormal posture: Secondary | ICD-10-CM

## 2022-08-19 DIAGNOSIS — M6281 Muscle weakness (generalized): Secondary | ICD-10-CM

## 2022-08-19 DIAGNOSIS — M62838 Other muscle spasm: Secondary | ICD-10-CM

## 2022-08-19 NOTE — Therapy (Signed)
OUTPATIENT PHYSICAL THERAPY FEMALE PELVIC TREATMENT   Patient Name: Amber Kelley MRN: [id number] DOB:[birth year], 37 y.o., female Today's Date: 08/19/2022  END OF SESSION:  PT End of Session - 08/19/22 1406     Visit Number 2    Date for PT Re-Evaluation 11/04/22    Authorization Type [employer]    PT Start Time 1406    PT Stop Time [id number]    PT Time Calculation (min) 39 min    Activity Tolerance Patient tolerated treatment well    Behavior During Therapy WFL for tasks assessed/performed              Past Medical History:  Diagnosis Date   Anxiety    Fibroid    Hypothyroidism    Keratoconus of both eyes    Preeclampsia    Pregnancy induced hypertension    Past Surgical History:  Procedure Laterality Date   CESAREAN SECTION N/A 05/21/2022   Procedure: CESAREAN SECTION;  Surgeon: Drema Dallas, DO;  Location: [facility];  Service: Obstetrics;  Laterality: N/A;   EYE SURGERY Bilateral 2019   per patient "CORNEAL CROSS-LINKING"   MYOMECTOMY N/A 05/07/2020   Procedure: ABDOMINAL MYOMECTOMY;  Surgeon: Drema Dallas, DO;  Location: [facility];  Service: Gynecology;  Laterality: N/A;   THYROIDECTOMY, PARTIAL  2012   Patient Active Problem List   Diagnosis Date Noted   History of myomectomy 05/20/2022   Fibroid uterus 05/02/2020   Hypothyroidism due to Hashimoto's thyroiditis 10/13/2017   Irritable bowel syndrome with diarrhea 10/13/2017   Allergic conjunctivitis of both eyes 07/26/2017   Myopia of both eyes with astigmatism 07/26/2017   Unstable keratoconus of both eyes 07/26/2017    [facility]  REFERRING PROVIDER: Drema Dallas, DO   REFERRING DIAG: N81.89 (ICD-10-CM) - Other female genital prolapse   THERAPY DIAG:  Other low back pain  Muscle weakness (generalized)  Other muscle spasm  Abnormal posture  Rationale for Evaluation and Treatment: Rehabilitation  ONSET DATE: after c-section 05/21/2022 things got worse, but had some things before  pregnancy  SUBJECTIVE:                                                                                                                                                                                           SUBJECTIVE STATEMENT: Pt states still feeling about the same.     Not breast feeding Fluid intake: Yes: 30 oz/day    PAIN:  Are you having pain? Yes NPRS scale: 7/10 at worst, typical day 5/10 Pain location:  low back and hip joints  Pain type: aching Pain description: intermittent   Aggravating factors: sitting on  the ground to standing it feels like there is grinding in the hips Relieving factors: walking around feels better in the hips, back always hurts  PRECAUTIONS: None  WEIGHT BEARING RESTRICTIONS: No  FALLS:  Has patient fallen in last 6 months? No  LIVING ENVIRONMENT: Lives with: lives with their spouse and 4 children Lives in: House/apartment   OCCUPATION: in school   PLOF: Independent  PATIENT GOALS: get rid of pain and be able to work out with leaking  PERTINENT HISTORY:  C-section recent Sexual abuse: No  BOWEL MOVEMENT: Pain with bowel movement: No Type of bowel movement:Type (Bristol Stool Scale) normal, Frequency normal, and Strain Yes every so often with digestive issues - maybe 1/month Fully empty rectum: Yes:   Leakage: No Pads: No Fiber supplement: No  URINATION: Pain with urination: No Fully empty bladder: Yes:   Stream: Strong Urgency: No Frequency: normal Leakage: Coughing, Exercise, and when bladder is full Pads: Yes: 2 pads/day  INTERCOURSE: Pain with intercourse: Initial Penetration and During Penetration Ability to have vaginal penetration:  Yes: but haven't really had intercourse since postpartum  Marinoff Scale: 3/3  PREGNANCY:  C-section deliveries 1 (3 other children not biological) Currently pregnant No  PROLAPSE: None   OBJECTIVE:   DIAGNOSTIC FINDINGS:    PATIENT SURVEYS:    PFIQ-7    COGNITION: Overall cognitive status: Within functional limits for tasks assessed     SENSATION: Light touch:  Proprioception:   MUSCLE LENGTH: Hamstrings: Right 80 deg; Left 70 deg Thomas test:  LUMBAR SPECIAL TESTS:  ASLR  FUNCTIONAL TESTS:  Single leg normal  GAIT:  Comments: pt was carrying baby in car seat  POSTURE: rounded shoulders, increased lumbar lordosis, increased thoracic kyphosis, and anterior pelvic tilt  PELVIC ALIGNMENT: left anterior rotation  LUMBARAROM/PROM:  A/PROM A/PROM  eval  Flexion WFL some pain when leaning fwd  Extension   Right lateral flexion WFL   Left lateral flexion WFL   Right rotation   Left rotation    (Blank rows = not tested)  LOWER EXTREMITY ROM:  P/ROM Right eval Left eval  Hip flexion 85% pain lateral hip 75% pain groin  Hip extension    Hip abduction    Hip adduction    Hip internal rotation WFL pain lateral hip 75% pain groin  Hip external rotation 50%   Knee flexion    Knee extension    Ankle dorsiflexion    Ankle plantarflexion    Ankle inversion    Ankle eversion     (Blank rows = not tested)  LOWER EXTREMITY MMT:  MMT Right eval Left eval  Hip flexion    Hip extension    Hip abduction 4-/5 4-/5  Hip adduction 4-/5 4-/5  Hip internal rotation    Hip external rotation    Knee flexion    Knee extension    Ankle dorsiflexion    Ankle plantarflexion    Ankle inversion    Ankle eversion     PALPATION:   General  tight Rt TFL and glutes, tight lumbar paraspinals                External Perineal Exam external muscle attachments tender to palpation                             Internal Pelvic Floor Rt side more inflamed, bil levators and obturators tight and tender to palpation but Rt side more  Patient confirms identification and approves PT to assess internal pelvic floor and treatment Yes  PELVIC MMT:   MMT eval  Vaginal 3/5, 5 reps, 1 x10sec hold  Internal Anal Sphincter   External Anal  Sphincter   Puborectalis   Diastasis Recti 1-2 fingers at and above umbilicus  (Blank rows = not tested)        TONE: high  PROLAPSE: no  TODAY'S TREATMENT:                                                                                                                              DATE: 08/19/22  NMRE: Supine exhale with core engaged - TC to keep ribcage down and activate more through the core not back Breathing and bulging pelvic floor Opposite UE to thigh Presses on thigh to activate transversus abdominus  Holding mat overhead and activate core   Exercises: Windshield wipers  Manual: Lumbar STM with cupping and c-section scar tissue release    PATIENT EDUCATION:  Education details:  Person educated: Patient Education method: Consulting civil engineer, Media planner, Verbal cues, and Handouts Education comprehension: verbalized understanding  HOME EXERCISE PROGRAM:   ASSESSMENT:  CLINICAL IMPRESSION: Today's session focused on core activation and releasing tension throughout lumbar and thoracic spine.  Pt has a lot of tension along spine and is using back when activating transversus abdominus.  Pt did well with cues and able to get good contraction in supine  Pt will benefit from skilled PT to continue to progress strength and increased muscle length for improved functional mobility.  OBJECTIVE IMPAIRMENTS: decreased coordination, decreased endurance, decreased ROM, decreased strength, increased fascial restrictions, increased muscle spasms, impaired flexibility, impaired tone, postural dysfunction, and pain.   ACTIVITY LIMITATIONS: lifting, bending, squatting, toileting, caring for others, and sitting on the floor, exercises  PARTICIPATION LIMITATIONS: interpersonal relationship and community activity  PERSONAL FACTORS: 1 comorbidity: c-section and chronic pain that is exacerbated since pregnancy  are also affecting patient's functional outcome.   REHAB POTENTIAL:  Excellent  CLINICAL DECISION MAKING: Evolving/moderate complexity  EVALUATION COMPLEXITY: Moderate   GOALS: Goals reviewed with patient? Yes  SHORT TERM GOALS: Target date: 09/09/22  Ind with scar massage Baseline: Goal status: IN PROGRESS  2.  Ind with transversus abdominus activation on exhale Baseline:  Goal status: IN PROGRESS  3.  Pt will report 25% less pain Baseline:  Goal status: IN PROGRESS    LONG TERM GOALS: Target date: 11/04/22  Pt will report 80% reduction of pain due to improvements in posture, strength, and muscle length  Baseline:  Goal status: INITIAL  2.  Pt will be independent with advanced HEP to maintain improvements made throughout therapy  Baseline:  Goal status: INITIAL  3.  Pt will have 1-2/3 score of Marinoff scale  Baseline: 3/3 Goal status: INITIAL  4.  Pt will be able to correctly activate their core and lift 20 lb x 5 reps Baseline:  Goal status: INITIAL  5.  Pt will be able to sit on the ground without increased pain Baseline:  Goal status: INITIAL   PLAN:  PT FREQUENCY: 1x/week  PT DURATION: 12 weeks  PLANNED INTERVENTIONS: Therapeutic exercises, Therapeutic activity, Neuromuscular re-education, Balance training, Gait training, Patient/Family education, Self Care, Joint mobilization, Aquatic Therapy, Dry Needling, Electrical stimulation, Cryotherapy, Moist heat, Taping, Biofeedback, Manual therapy, and Re-evaluation  PLAN FOR NEXT SESSION: transversus abdominus activation - progress core strength; possibly dry needling  to thoracic and lumbar; cat cow, thoracic rotation and MET in sitting   [employer], PT 08/19/2022, 4:04 PM

## 2022-08-24 NOTE — Therapy (Unsigned)
OUTPATIENT PHYSICAL THERAPY FEMALE PELVIC TREATMENT   Patient Name: Amber Kelley MRN: [id number] DOB:1985/09/03, 37 y.o., female Today's Date: 08/25/2022  END OF SESSION:  PT End of Session - 08/25/22 1050     Visit Number 3    Date for PT Re-Evaluation 11/04/22    Authorization Type U[employer]    PT Start Time 1017    PT Stop Time 1058    PT Time Calculation (min) 41 min    Activity Tolerance Patient tolerated treatment well    Behavior During Therapy WFL for tasks assessed/performed               Past Medical History:  Diagnosis Date   Anxiety    Fibroid    Hypothyroidism    Keratoconus of both eyes    Preeclampsia    Pregnancy induced hypertension    Past Surgical History:  Procedure Laterality Date   CESAREAN SECTION N/A 05/21/2022   Procedure: CESAREAN SECTION;  Surgeon: Drema Dallas, DO;  Location: [facility];  Service: Obstetrics;  Laterality: N/A;   EYE SURGERY Bilateral 2019   per patient "CORNEAL CROSS-LINKING"   MYOMECTOMY N/A 05/07/2020   Procedure: ABDOMINAL MYOMECTOMY;  Surgeon: Drema Dallas, DO;  Location: [facility];  Service: Gynecology;  Laterality: N/A;   THYROIDECTOMY, PARTIAL  2012   Patient Active Problem List   Diagnosis Date Noted   History of myomectomy 05/20/2022   Fibroid uterus 05/02/2020   Hypothyroidism due to Hashimoto's thyroiditis 10/13/2017   Irritable bowel syndrome with diarrhea 10/13/2017   Allergic conjunctivitis of both eyes 07/26/2017   Myopia of both eyes with astigmatism 07/26/2017   Unstable keratoconus of both eyes 07/26/2017    [facility]  REFERRING PROVIDER: Drema Dallas, DO   REFERRING DIAG: N81.89 (ICD-10-CM) - Other female genital prolapse   THERAPY DIAG:  Other low back pain  Muscle weakness (generalized)  Other muscle spasm  Abnormal posture  Rationale for Evaluation and Treatment: Rehabilitation  ONSET DATE: after c-section 05/21/2022 things got worse, but had some things before  pregnancy  SUBJECTIVE:                                                                                                                                                                                           SUBJECTIVE STATEMENT: Pt feels like slept wrong last night and left mid back is tight but low back is still the same right now.  I am engaging the core.    Not breast feeding Fluid intake: Yes: 30 oz/day    PAIN:  Are you having pain? Yes NPRS scale: 7/10 at worst, typical day 5/10  Pain location:  low back and hip joints  Pain type: aching Pain description: intermittent   Aggravating factors: sitting on the ground to standing it feels like there is grinding in the hips Relieving factors: walking around feels better in the hips, back always hurts  PRECAUTIONS: None  WEIGHT BEARING RESTRICTIONS: No  FALLS:  Has patient fallen in last 6 months? No  LIVING ENVIRONMENT: Lives with: lives with their spouse and 4 children Lives in: House/apartment   OCCUPATION: in school   PLOF: Independent  PATIENT GOALS: get rid of pain and be able to work out with leaking  PERTINENT HISTORY:  C-section recent Sexual abuse: No  BOWEL MOVEMENT: Pain with bowel movement: No Type of bowel movement:Type (Bristol Stool Scale) normal, Frequency normal, and Strain Yes every so often with digestive issues - maybe 1/month Fully empty rectum: Yes:   Leakage: No Pads: No Fiber supplement: No  URINATION: Pain with urination: No Fully empty bladder: Yes:   Stream: Strong Urgency: No Frequency: normal Leakage: Coughing, Exercise, and when bladder is full Pads: Yes: 2 pads/day  INTERCOURSE: Pain with intercourse: Initial Penetration and During Penetration Ability to have vaginal penetration:  Yes: but haven't really had intercourse since postpartum  Marinoff Scale: 3/3  PREGNANCY:  C-section deliveries 1 (3 other children not biological) Currently pregnant  No  PROLAPSE: None   OBJECTIVE:   DIAGNOSTIC FINDINGS:    PATIENT SURVEYS:    PFIQ-7   COGNITION: Overall cognitive status: Within functional limits for tasks assessed     SENSATION: Light touch:  Proprioception:   MUSCLE LENGTH: Hamstrings: Right 80 deg; Left 70 deg Thomas test:  LUMBAR SPECIAL TESTS:  ASLR  FUNCTIONAL TESTS:  Single leg normal  GAIT:  Comments: pt was carrying baby in car seat  POSTURE: rounded shoulders, increased lumbar lordosis, increased thoracic kyphosis, and anterior pelvic tilt  PELVIC ALIGNMENT: left anterior rotation  LUMBARAROM/PROM:  A/PROM A/PROM  eval  Flexion WFL some pain when leaning fwd  Extension   Right lateral flexion WFL   Left lateral flexion WFL   Right rotation   Left rotation    (Blank rows = not tested)  LOWER EXTREMITY ROM:  P/ROM Right eval Left eval  Hip flexion 85% pain lateral hip 75% pain groin  Hip extension    Hip abduction    Hip adduction    Hip internal rotation WFL pain lateral hip 75% pain groin  Hip external rotation 50%   Knee flexion    Knee extension    Ankle dorsiflexion    Ankle plantarflexion    Ankle inversion    Ankle eversion     (Blank rows = not tested)  LOWER EXTREMITY MMT:  MMT Right eval Left eval  Hip flexion    Hip extension    Hip abduction 4-/5 4-/5  Hip adduction 4-/5 4-/5  Hip internal rotation    Hip external rotation    Knee flexion    Knee extension    Ankle dorsiflexion    Ankle plantarflexion    Ankle inversion    Ankle eversion     PALPATION:   General  tight Rt TFL and glutes, tight lumbar paraspinals                External Perineal Exam external muscle attachments tender to palpation                             Internal  Pelvic Floor Rt side more inflamed, bil levators and obturators tight and tender to palpation but Rt side more   Patient confirms identification and approves PT to assess internal pelvic floor and treatment  Yes  PELVIC MMT:   MMT eval  Vaginal 3/5, 5 reps, 1 x10sec hold  Internal Anal Sphincter   External Anal Sphincter   Puborectalis   Diastasis Recti 1-2 fingers at and above umbilicus  (Blank rows = not tested)        TONE: high  PROLAPSE: no  TODAY'S TREATMENT:                                                                                                                              DATE: 08/25/22  NMRE:  Opposite UE to thigh sitting with yoga ball Standing wall push up Standing reaches in plank at wall Standing posterior pelvic tilt - UE lifts 10 x bil Standing single arm reach to 90 with green band 10x bil    Exercises: Quadruped rotation - UE up and thread - 10x Child pose with breathing   Manual: Lumbar, gluteal, thoracic paraspinal STM Trigger Point Dry-Needling  Treatment instructions: Expect mild to moderate muscle soreness. S/S of pneumothorax if dry needled over a lung field, and to seek immediate medical attention should they occur. Patient verbalized understanding of these instructions and education.  Patient Consent Given: Yes Education handout provided: Yes Muscles treated: lumbar, thoracic multifidi, bil glute med Electrical stimulation performed: No Parameters: N/A Treatment response/outcome: increased soft tissue length palpated      PATIENT EDUCATION:  Education details: Access Code: [email or web address] Person educated: Patient Education method: Consulting civil engineer, Media planner, Verbal cues, and Handouts Education comprehension: verbalized understanding  HOME EXERCISE PROGRAM: Access Code: [email or web address] URL: [email or web address] Date: 08/25/2022 Prepared by: Jari Favre  Exercises - Supine Transversus Abdominis Bracing - Hands on Thighs  - 1 x daily - 7 x weekly - 1 sets - 10 reps - 5 sec hold - Sidelying Thoracic Rotation with Open Book  - 1 x daily - 7 x weekly - 1 sets - 5 reps - 10 sec hold - Supine Diaphragmatic Breathing  - 3 x  daily - 7 x weekly - 1 sets - 10 reps - Supine Hip Internal and External Rotation  - 1 x daily - 7 x weekly - 1 sets - 10 reps - 5 sec hold  ASSESSMENT:  CLINICAL IMPRESSION: Today's session focused on core activation and releasing tension throughout lumbar and thoracic spine and gluteals.  Pt had good release palpated in back and hips after dry needling with manual techniques.  Pt did well with progressing to seated and standing exercises.  Pt will benefit from skilled PT to continue to progress strength and increased muscle length for improved functional mobility.  OBJECTIVE IMPAIRMENTS: decreased coordination, decreased endurance, decreased ROM, decreased strength, increased fascial restrictions, increased muscle spasms, impaired flexibility, impaired tone, postural dysfunction, and pain.   ACTIVITY LIMITATIONS: lifting,  bending, squatting, toileting, caring for others, and sitting on the floor, exercises  PARTICIPATION LIMITATIONS: interpersonal relationship and community activity  PERSONAL FACTORS: 1 comorbidity: c-section and chronic pain that is exacerbated since pregnancy  are also affecting patient's functional outcome.   REHAB POTENTIAL: Excellent  CLINICAL DECISION MAKING: Evolving/moderate complexity  EVALUATION COMPLEXITY: Moderate   GOALS: Goals reviewed with patient? Yes  SHORT TERM GOALS: Target date: 09/09/22 Updated 08/25/22  Ind with scar massage Baseline: Goal status: MET  2.  Ind with transversus abdominus activation on exhale Baseline:  Goal status: MET  3.  Pt will report 25% less pain Baseline: same 5/10 today Goal status: IN PROGRESS    LONG TERM GOALS: Target date: 11/04/22  Pt will report 80% reduction of pain due to improvements in posture, strength, and muscle length  Baseline:  Goal status: INITIAL  2.  Pt will be independent with advanced HEP to maintain improvements made throughout therapy  Baseline:  Goal status: INITIAL  3.  Pt will  have 1-2/3 score of Marinoff scale  Baseline: 3/3 Goal status: INITIAL  4.  Pt will be able to correctly activate their core and lift 20 lb x 5 reps Baseline:  Goal status: INITIAL  5.  Pt will be able to sit on the ground without increased pain Baseline:  Goal status: INITIAL   PLAN:  PT FREQUENCY: 1x/week  PT DURATION: 12 weeks  PLANNED INTERVENTIONS: Therapeutic exercises, Therapeutic activity, Neuromuscular re-education, Balance training, Gait training, Patient/Family education, Self Care, Joint mobilization, Aquatic Therapy, Dry Needling, Electrical stimulation, Cryotherapy, Moist heat, Taping, Biofeedback, Manual therapy, and Re-evaluation  PLAN FOR NEXT SESSION: transversus abdominus activation - progress core strength; f/u on dry needling  to thoracic and lumbar and glute med; cat cow, thoracic rotation and MET in sitting   Jakki L Quanna Wittke, PT 08/25/2022, 11:10 AM

## 2022-08-25 ENCOUNTER — Ambulatory Visit: Payer: Medicaid Other | Attending: Obstetrics and Gynecology | Admitting: Physical Therapy

## 2022-08-25 ENCOUNTER — Encounter: Payer: Self-pay | Admitting: Physical Therapy

## 2022-08-25 DIAGNOSIS — M62838 Other muscle spasm: Secondary | ICD-10-CM | POA: Diagnosis present

## 2022-08-25 DIAGNOSIS — M6281 Muscle weakness (generalized): Secondary | ICD-10-CM | POA: Insufficient documentation

## 2022-08-25 DIAGNOSIS — M5459 Other low back pain: Secondary | ICD-10-CM | POA: Diagnosis present

## 2022-08-25 DIAGNOSIS — R293 Abnormal posture: Secondary | ICD-10-CM | POA: Diagnosis present

## 2022-08-29 ENCOUNTER — Ambulatory Visit
Admission: EM | Admit: 2022-08-29 | Discharge: 2022-08-29 | Disposition: A | Discharge status: Home / Self Care | Payer: Medicaid Other | Attending: Nurse Practitioner | Admitting: Nurse Practitioner

## 2022-08-29 DIAGNOSIS — N3000 Acute cystitis without hematuria: Secondary | ICD-10-CM | POA: Diagnosis present

## 2022-08-29 DIAGNOSIS — R350 Frequency of micturition: Secondary | ICD-10-CM | POA: Diagnosis present

## 2022-08-29 DIAGNOSIS — R42 Dizziness and giddiness: Secondary | ICD-10-CM

## 2022-08-29 LAB — POCT URINALYSIS DIP (MANUAL ENTRY)
Bilirubin, UA: NEGATIVE
Blood, UA: NEGATIVE
Glucose, UA: NEGATIVE mg/dL
Ketones, POC UA: NEGATIVE mg/dL
Nitrite, UA: NEGATIVE
Spec Grav, UA: 1.025 (ref 1.010–1.025)
Urobilinogen, UA: 0.2 E.U./dL
pH, UA: 5.5 (ref 5.0–8.0)

## 2022-08-29 LAB — POCT FASTING CBG KUC MANUAL ENTRY: POCT Glucose (KUC): 132 mg/dL — AB (ref 70–99)

## 2022-08-29 LAB — POCT URINE PREGNANCY: Preg Test, Ur: NEGATIVE

## 2022-08-29 MED ORDER — CEPHALEXIN 500 MG PO CAPS: 500.0000 mg | ORAL_CAPSULE | Freq: Two times a day (BID) | ORAL | 0 refills | Status: AC

## 2022-08-29 NOTE — Discharge Instructions (Signed)
Start Keflex twice daily for 7 days.  No clinical send her urine for culture and contact you if that is positive Your blood pressure ranges are normal.  Your blood sugar is also normal.  And your EKG is improved from a previous EKG you had done in January 2023.  If your symptoms do not improve within the next 24 hours after treatment on antibiotics and or your symptoms worsen please go to the emergency room ASAP for further evaluation. Follow-up with your PCP in 2 days for recheck

## 2022-08-29 NOTE — ED Triage Notes (Signed)
Pt presents with c/o hypertension, dizziness, headaches, nausea and states she has a hard time maintaining focus. States she will watch a movie and states her eyes will go out of focus, not blurry. Pt states the dizziness started Tuesday and nausea on Friday. States the headaches are consistent and losing focus.

## 2022-08-29 NOTE — ED Provider Notes (Signed)
[facility]    CSN: [id number] Arrival date & time: 08/29/22  1010      History   Chief Complaint No chief complaint on file.   HPI Amber Kelley is a 37 y.o. female presents for evaluation of dizziness, headache.  Patient reports 5 days ago she developed intermittent dizziness that she describes as her spinning/lightheadedness.  She has had some nausea but denies vomiting, syncope.  Does endorse a frontal headache that is intermittent and rates as a 3 out of 10 and denies this is the worst headache of her life.  This improves with over-the-counter analgesics.  Endorses difficulty focusing but denies blurry or double vision.  Denies any neck pain.  She endorses urinary frequency but denies dysuria or hematuria.  She also states she checked her blood pressure at home on first day of symptoms and it was 160/106.  She states she had a history of preeclampsia while pregnant and was on medication until mid January.  She gave birth in December.  She is not breast-feeding.  She states her blood pressure normally is 110's over 70s.  She has a history of hypothyroidism but denies history of diabetes, vertigo, migraines.  States she is staying hydrated.  Denies any increase in caffeine usage.  No chest pain or shortness of breath with her symptoms.  No unilateral leg swelling.  No URI symptoms.  She is eating normally as well.  No other concerns at this time.  HPI  Past Medical History:  Diagnosis Date   Anxiety    Fibroid    Hypothyroidism    Keratoconus of both eyes    Preeclampsia    Pregnancy induced hypertension     Patient Active Problem List   Diagnosis Date Noted   History of myomectomy 05/20/2022   Fibroid uterus 05/02/2020   Hypothyroidism due to Hashimoto's thyroiditis 10/13/2017   Irritable bowel syndrome with diarrhea 10/13/2017   Allergic conjunctivitis of both eyes 07/26/2017   Myopia of both eyes with astigmatism 07/26/2017   Unstable keratoconus of both eyes  07/26/2017    Past Surgical History:  Procedure Laterality Date   CESAREAN SECTION N/A 05/21/2022   Procedure: CESAREAN SECTION;  Surgeon: Drema Dallas, DO;  Location: [facility];  Service: Obstetrics;  Laterality: N/A;   EYE SURGERY Bilateral 2019   per patient "CORNEAL CROSS-LINKING"   MYOMECTOMY N/A 05/07/2020   Procedure: ABDOMINAL MYOMECTOMY;  Surgeon: Drema Dallas, DO;  Location: [facility];  Service: Gynecology;  Laterality: N/A;   THYROIDECTOMY, PARTIAL  2012    OB History     Gravida  2   Para  1   Term  0   Preterm  1   AB  1   Living  1      SAB  1   IAB      Ectopic      Multiple  0   Live Births  1            Home Medications    Prior to Admission medications   Medication Sig Start Date End Date Taking? Authorizing Provider  cephALEXin (KEFLEX) 500 MG capsule Take 1 capsule (500 mg total) by mouth 2 (two) times daily for 7 days. 08/29/22 09/05/22 Yes Melynda Ripple, NP  acetaminophen (TYLENOL) 500 MG tablet Take 500 mg by mouth every 6 (six) hours as needed for moderate pain or headache. Patient not taking: Reported on 08/12/2022    [provider]  ferrous sulfate 324 MG  TBEC Take 324 mg by mouth daily with breakfast. Patient not taking: Reported on 08/12/2022    [provider]  ferrous sulfate 325 (65 FE) MG tablet Take 1 tablet (325 mg total) by mouth daily. Patient not taking: Reported on 08/12/2022 05/23/22 05/23/23  Josefine Class, MD  fluticasone [facility]) 50 MCG/ACT nasal spray Place into both nostrils. 05/05/22   [provider]  levothyroxine (SYNTHROID) 88 MCG tablet Take 88 mcg by mouth daily before breakfast.  10/27/17   [provider]  NIFEdipine (ADALAT CC) 30 MG 24 hr tablet Take 1 tablet (30 mg total) by mouth daily. Patient not taking: Reported on 08/12/2022 05/24/22 09/21/22  Josefine Class, MD  Prenatal Vit-Fe Fumarate-FA (MULTIVITAMIN-PRENATAL) 27-0.8 MG TABS tablet Take 1 tablet by mouth  daily at 12 noon. Patient not taking: Reported on 08/12/2022    [provider]  sertraline (ZOLOFT) 25 MG tablet Take 50 mg by mouth daily. 04/08/22   [provider]    Family History Family History  Family history unknown: Yes    Social History Social History   Tobacco Use   Smoking status: Never   Smokeless tobacco: Never  Vaping Use   Vaping Use: Never used  Substance Use Topics   Alcohol use: Not Currently    Comment: rarely   Drug use: Never     Allergies   Banana   Review of Systems Review of Systems  Gastrointestinal:  Positive for nausea.  Genitourinary:  Positive for frequency.  Neurological:  Positive for dizziness, light-headedness and headaches.     Physical Exam Triage Vital Signs ED Triage Vitals  Enc Vitals Group     BP 08/29/22 1018 137/88     Pulse Rate 08/29/22 1018 80     Resp 08/29/22 1018 18     Temp 08/29/22 1018 97.7 F (36.5 C)     Temp Source 08/29/22 1018 Oral     SpO2 08/29/22 1018 98 %     Weight --      Height --      Head Circumference --      Peak Flow --      Pain Score 08/29/22 1017 0     Pain Loc --      Pain Edu? --      Excl. in [place]? --    Orthostatic VS for the past 24 hrs:  BP- Lying Pulse- Lying BP- Sitting Pulse- Sitting BP- Standing at 0 minutes Pulse- Standing at 0 minutes  08/29/22 1112 128/88 74 128/86 72 131/89 84    Updated Vital Signs BP 137/88 (BP Location: Left Arm)   Pulse 80   Temp 97.7 F (36.5 C) (Oral)   Resp 18   LMP 07/27/2022 (Exact Date)   SpO2 98%   Breastfeeding No   Visual Acuity Right Eye Distance:   Left Eye Distance:   Bilateral Distance:    Right Eye Near:   Left Eye Near:    Bilateral Near:     Physical Exam Vitals and nursing note reviewed.  Constitutional:      General: She is not in acute distress.    Appearance: Normal appearance. She is not ill-appearing.  HENT:     Head: Normocephalic and atraumatic.     Right Ear: Tympanic membrane and ear  canal normal.     Left Ear: Tympanic membrane and ear canal normal.  Eyes:     Extraocular Movements: Extraocular movements intact.     Pupils: Pupils  are equal, round, and reactive to light.  Cardiovascular:     Rate and Rhythm: Normal rate and regular rhythm.     Heart sounds: Normal heart sounds. No murmur heard.    No gallop.  Pulmonary:     Effort: Pulmonary effort is normal.     Breath sounds: Normal breath sounds.  Skin:    General: Skin is warm and dry.  Neurological:     General: No focal deficit present.     Mental Status: She is alert and oriented to person, place, and time.     GCS: GCS eye subscore is 4. GCS verbal subscore is 5. GCS motor subscore is 6.     Cranial Nerves: No facial asymmetry.     Motor: No weakness.     Coordination: Romberg sign negative.  Psychiatric:        Mood and Affect: Mood normal.        Behavior: Behavior normal.      UC Treatments / Results  Labs (all labs ordered are listed, but only abnormal results are displayed) Labs Reviewed  POCT FASTING CBG [facility] MANUAL ENTRY - Abnormal; Notable for the following components:      Result Value   POCT Glucose ([facility]) 132 (*)    All other components within normal limits  POCT URINALYSIS DIP (MANUAL ENTRY) - Abnormal; Notable for the following components:   Protein Ur, POC trace (*)    Leukocytes, UA Trace (*)    All other components within normal limits  URINE CULTURE  POCT URINE PREGNANCY    EKG   Radiology No results found.  Procedures ED EKG  Date/Time: 08/29/2022 11:06 AM  Performed by: Melynda Ripple, NP Authorized by: Melynda Ripple, NP   ECG interpreted by ED Physician in the absence of a cardiologist: no   Previous ECG:    Previous ECG:  Compared to current   Similarity:  Changes noted   Comparison ECG info:  EKG from 06/2021 showed sinus tachycardia with ST-T wave abnormalities in setting of bacterial upper respiratory infection Rate:    ECG rate:  71 Rhythm:    Rhythm:  sinus rhythm   Ectopy:    Ectopy: none   ST segments:    ST segments:  Normal T waves:    T waves: non-specific   Comments:     Sinus rhythm heart rate 71 with nonspecific T wave abnormalities improved from EKG from January 2023  (including critical care time)  Medications Ordered in UC Medications - No data to display  Initial Impression / Assessment and Plan / UC Course  I have reviewed the triage vital signs and the nursing notes.  Pertinent labs & imaging results that were available during my care of the patient were reviewed by me and considered in my medical decision making (see chart for details).     Discussed exam and symptoms with patient Blood sugar 132.  Orthostatic blood pressures are negative Urine does show trace leuks, will culture and start Keflex twice daily for 7 days EKG with some nonspecific T wave changes.  Improved from previous EKG.  She denies any chest pain or shortness of breath.  Discussed multiple causes of dizziness including cardiac, neurological, dehydration, etc.  Patient denies worst headache of her life.  Denies worsening symptoms Discussed with patient if symptoms do not improve in the next 24 hours on antibiotics and or symptoms worsen she is to go to the ER ASAP for further evaluation.  Patient verbalized understanding Follow-up with PCP in 2 days for recheck Final Clinical Impressions(s) / UC Diagnoses   Final diagnoses:  Dizziness  Urinary frequency  Acute cystitis without hematuria     Discharge Instructions      Start Keflex twice daily for 7 days.  No clinical send her urine for culture and contact you if that is positive Your blood pressure ranges are normal.  Your blood sugar is also normal.  And your EKG is improved from a previous EKG you had done in January 2023.  If your symptoms do not improve within the next 24 hours after treatment on antibiotics and or your symptoms worsen please go to the emergency room ASAP for further  evaluation. Follow-up with your PCP in 2 days for recheck     ED Prescriptions     Medication Sig Dispense Auth. Provider   cephALEXin (KEFLEX) 500 MG capsule Take 1 capsule (500 mg total) by mouth 2 (two) times daily for 7 days. 14 capsule Melynda Ripple, NP      PDMP not reviewed this encounter.   Melynda Ripple, NP 08/29/22 1128

## 2022-08-30 LAB — URINE CULTURE: Culture: NO GROWTH

## 2022-09-02 ENCOUNTER — Ambulatory Visit: Payer: Medicaid Other | Admitting: Physical Therapy

## 2022-09-02 DIAGNOSIS — R293 Abnormal posture: Secondary | ICD-10-CM

## 2022-09-02 DIAGNOSIS — M62838 Other muscle spasm: Secondary | ICD-10-CM

## 2022-09-02 DIAGNOSIS — M5459 Other low back pain: Secondary | ICD-10-CM | POA: Diagnosis not present

## 2022-09-02 DIAGNOSIS — M6281 Muscle weakness (generalized): Secondary | ICD-10-CM

## 2022-09-02 NOTE — Therapy (Signed)
OUTPATIENT PHYSICAL THERAPY FEMALE PELVIC TREATMENT   Patient Name: Amber Kelley MRN: [id number] DOB:[birth year], 37 y.o., female Today's Date: 09/03/2022  END OF SESSION:  PT End of Session - 09/02/22 1148     Visit Number 4    Date for PT Re-Evaluation 11/04/22    Authorization Type [employer]    PT Start Time [id number]    PT Stop Time 1228    PT Time Calculation (min) 40 min    Activity Tolerance Patient tolerated treatment well    Behavior During Therapy WFL for tasks assessed/performed                Past Medical History:  Diagnosis Date   Anxiety    Fibroid    Hypothyroidism    Keratoconus of both eyes    Preeclampsia    Pregnancy induced hypertension    Past Surgical History:  Procedure Laterality Date   CESAREAN SECTION N/A 05/21/2022   Procedure: CESAREAN SECTION;  Surgeon: Drema Dallas, DO;  Location: [facility];  Service: Obstetrics;  Laterality: N/A;   EYE SURGERY Bilateral 2019   per patient "CORNEAL CROSS-LINKING"   MYOMECTOMY N/A 05/07/2020   Procedure: ABDOMINAL MYOMECTOMY;  Surgeon: Drema Dallas, DO;  Location: [place];  Service: Gynecology;  Laterality: N/A;   THYROIDECTOMY, PARTIAL  2012   Patient Active Problem List   Diagnosis Date Noted   History of myomectomy 05/20/2022   Fibroid uterus 05/02/2020   Hypothyroidism due to Hashimoto's thyroiditis 10/13/2017   Irritable bowel syndrome with diarrhea 10/13/2017   Allergic conjunctivitis of both eyes 07/26/2017   Myopia of both eyes with astigmatism 07/26/2017   Unstable keratoconus of both eyes 07/26/2017    PCP: [facility]  REFERRING PROVIDER: Drema Dallas, DO   REFERRING DIAG: N81.89 (ICD-10-CM) - Other female genital prolapse   THERAPY DIAG:  Other low back pain  Muscle weakness (generalized)  Other muscle spasm  Abnormal posture  Rationale for Evaluation and Treatment: Rehabilitation  ONSET DATE: after c-section 05/21/2022 things got worse, but had some things before  pregnancy  SUBJECTIVE:                                                                                                                                                                                           SUBJECTIVE STATEMENT: Pt feels like the dry needling helped and the tension didn't come back as fast this time - lasted a couple of days    Not breast feeding Fluid intake: Yes: 30 oz/day    PAIN:  Are you having pain? Yes NPRS scale: 3/10 Pain location:  low back and hip joints  Pain type: aching, discomfort Pain description: intermittent   Aggravating factors: sitting on the ground or one position >30 min; to standing it feels like there is grinding in the hips Relieving factors: walking around feels better in the hips, back always hurts  PRECAUTIONS: None  WEIGHT BEARING RESTRICTIONS: No  FALLS:  Has patient fallen in last 6 months? No  LIVING ENVIRONMENT: Lives with: lives with their spouse and 4 children Lives in: House/apartment   OCCUPATION: in school   PLOF: Independent  PATIENT GOALS: get rid of pain and be able to work out with leaking  PERTINENT HISTORY:  C-section recent Sexual abuse: No  BOWEL MOVEMENT: Pain with bowel movement: No Type of bowel movement:Type (Bristol Stool Scale) normal, Frequency normal, and Strain Yes every so often with digestive issues - maybe 1/month Fully empty rectum: Yes:   Leakage: No Pads: No Fiber supplement: No  URINATION: Pain with urination: No Fully empty bladder: Yes:   Stream: Strong Urgency: No Frequency: normal Leakage: Coughing, Exercise, and when bladder is full Pads: Yes: 2 pads/day  INTERCOURSE: Pain with intercourse: Initial Penetration and During Penetration Ability to have vaginal penetration:  Yes: but haven't really had intercourse since postpartum  Marinoff Scale: 3/3  PREGNANCY:  C-section deliveries 1 (3 other children not biological) Currently pregnant  No  PROLAPSE: None   OBJECTIVE:   DIAGNOSTIC FINDINGS:    PATIENT SURVEYS:    PFIQ-7   COGNITION: Overall cognitive status: Within functional limits for tasks assessed     SENSATION: Light touch:  Proprioception:   MUSCLE LENGTH: Hamstrings: Right 80 deg; Left 70 deg Thomas test:  LUMBAR SPECIAL TESTS:  ASLR  FUNCTIONAL TESTS:  Single leg normal  GAIT:  Comments: pt was carrying baby in car seat  POSTURE: rounded shoulders, increased lumbar lordosis, increased thoracic kyphosis, and anterior pelvic tilt  PELVIC ALIGNMENT: left anterior rotation  LUMBARAROM/PROM:  A/PROM A/PROM  eval  Flexion WFL some pain when leaning fwd  Extension   Right lateral flexion WFL   Left lateral flexion WFL   Right rotation   Left rotation    (Blank rows = not tested)  LOWER EXTREMITY ROM:  P/ROM Right eval Left eval  Hip flexion 85% pain lateral hip 75% pain groin  Hip extension    Hip abduction    Hip adduction    Hip internal rotation WFL pain lateral hip 75% pain groin  Hip external rotation 50%   Knee flexion    Knee extension    Ankle dorsiflexion    Ankle plantarflexion    Ankle inversion    Ankle eversion     (Blank rows = not tested)  LOWER EXTREMITY MMT:  MMT Right eval Left eval  Hip flexion    Hip extension    Hip abduction 4-/5 4-/5  Hip adduction 4-/5 4-/5  Hip internal rotation    Hip external rotation    Knee flexion    Knee extension    Ankle dorsiflexion    Ankle plantarflexion    Ankle inversion    Ankle eversion     PALPATION:   General  tight Rt TFL and glutes, tight lumbar paraspinals                External Perineal Exam external muscle attachments tender to palpation                             Internal Pelvic Floor Rt  side more inflamed, bil levators and obturators tight and tender to palpation but Rt side more   Patient confirms identification and approves PT to assess internal pelvic floor and treatment  Yes  PELVIC MMT:   MMT eval  Vaginal 3/5, 5 reps, 1 x10sec hold  Internal Anal Sphincter   External Anal Sphincter   Puborectalis   Diastasis Recti 1-2 fingers at and above umbilicus  (Blank rows = not tested)        TONE: high  PROLAPSE: no  TODAY'S TREATMENT:                                                                                                                              DATE:  09/02/22  NMRE:  Opposite UE to thigh feet on foam roll - 10x LE in 90-90 - tapping down - 10x  Foam roll under pelvis knee to chest - core engaged doing alt LE ext - 3 x 10   Exercises: Hip flexor stretch with rotation   Manual: Lumbar, hip flexor, gluteal, thoracic paraspinal STM Trigger Point Dry-Needling  Treatment instructions: Expect mild to moderate muscle soreness. S/S of pneumothorax if dry needled over a lung field, and to seek immediate medical attention should they occur. Patient verbalized understanding of these instructions and education.  Patient Consent Given: Yes Education handout provided: Yes Muscles treated: lumbar, thoracic multifidi, bil glute med, bil psoas Electrical stimulation performed: No Parameters: N/A Treatment response/outcome: increased soft tissue length palpated    08/25/22  NMRE:  Opposite UE to thigh sitting with yoga ball Standing wall push up Standing reaches in plank at wall Standing posterior pelvic tilt - UE lifts 10 x bil Standing single arm reach to 90 with green band 10x bil    Exercises: Quadruped rotation - UE up and thread - 10x Child pose with breathing   Manual: Lumbar, gluteal, thoracic paraspinal STM Trigger Point Dry-Needling  Treatment instructions: Expect mild to moderate muscle soreness. S/S of pneumothorax if dry needled over a lung field, and to seek immediate medical attention should they occur. Patient verbalized understanding of these instructions and education.  Patient Consent Given: Yes Education handout  provided: Yes Muscles treated: lumbar, thoracic multifidi, bil glute med Electrical stimulation performed: No Parameters: N/A Treatment response/outcome: increased soft tissue length palpated      PATIENT EDUCATION:  Education details: Access Code: [email or web address] Person educated: Patient Education method: Consulting civil engineer, Media planner, Verbal cues, and Handouts Education comprehension: verbalized understanding  HOME EXERCISE PROGRAM: Access Code: [email or web address] URL: [email or web address] Date: 08/25/2022 Prepared by: Jari Favre  Exercises - Supine Transversus Abdominis Bracing - Hands on Thighs  - 1 x daily - 7 x weekly - 1 sets - 10 reps - 5 sec hold - Sidelying Thoracic Rotation with Open Book  - 1 x daily - 7 x weekly - 1 sets - 5 reps - 10 sec hold - Supine Diaphragmatic Breathing  - 3 x daily - 7 x  weekly - 1 sets - 10 reps - Supine Hip Internal and External Rotation  - 1 x daily - 7 x weekly - 1 sets - 10 reps - 5 sec hold  ASSESSMENT:  CLINICAL IMPRESSION: Today's session focused again on core activation and releasing tension throughout lumbar and thoracic spine and gluteals.  Added release to hip flexors which was more tight on the Lt side. Pt had good release palpated in back and hips after dry needling with manual techniques.  Pt did well with progressing more challenging exercises in supine. Pt was using hip flexors a little more when doing LE in 90-90 so modified for more core activation.  Pt will benefit from skilled PT to continue to progress strength and increased muscle length for improved functional mobility.  OBJECTIVE IMPAIRMENTS: decreased coordination, decreased endurance, decreased ROM, decreased strength, increased fascial restrictions, increased muscle spasms, impaired flexibility, impaired tone, postural dysfunction, and pain.   ACTIVITY LIMITATIONS: lifting, bending, squatting, toileting, caring for others, and sitting on the floor,  exercises  PARTICIPATION LIMITATIONS: interpersonal relationship and community activity  PERSONAL FACTORS: 1 comorbidity: c-section and chronic pain that is exacerbated since pregnancy  are also affecting patient's functional outcome.   REHAB POTENTIAL: Excellent  CLINICAL DECISION MAKING: Evolving/moderate complexity  EVALUATION COMPLEXITY: Moderate   GOALS: Goals reviewed with patient? Yes  SHORT TERM GOALS: Target date: 09/09/22 Updated 08/25/22  Ind with scar massage Baseline: Goal status: MET  2.  Ind with transversus abdominus activation on exhale Baseline:  Goal status: MET  3.  Pt will report 25% less pain Baseline: 3/10 down from 5/10 initially Goal status: MET    LONG TERM GOALS: Target date: 11/04/22  Pt will report 80% reduction of pain due to improvements in posture, strength, and muscle length  Baseline:  Goal status: INITIAL  2.  Pt will be independent with advanced HEP to maintain improvements made throughout therapy  Baseline:  Goal status: INITIAL  3.  Pt will have 1-2/3 score of Marinoff scale  Baseline: 3/3 Goal status: INITIAL  4.  Pt will be able to correctly activate their core and lift 20 lb x 5 reps Baseline:  Goal status: INITIAL  5.  Pt will be able to sit on the ground without increased pain Baseline:  Goal status: INITIAL   PLAN:  PT FREQUENCY: 1x/week  PT DURATION: 12 weeks  PLANNED INTERVENTIONS: Therapeutic exercises, Therapeutic activity, Neuromuscular re-education, Balance training, Gait training, Patient/Family education, Self Care, Joint mobilization, Aquatic Therapy, Dry Needling, Electrical stimulation, Cryotherapy, Moist heat, Taping, Biofeedback, Manual therapy, and Re-evaluation  PLAN FOR NEXT SESSION: transversus abdominus activation - progress core strength; f/u on dry needling #2  to thoracic and lumbar and glute med; cat cow, thoracic rotation and MET in sitting, half kneel with band   [employer],  PT 09/03/2022, 3:07 PM

## 2022-09-09 ENCOUNTER — Ambulatory Visit: Payer: Medicaid Other | Admitting: Physical Therapy

## 2022-09-09 DIAGNOSIS — M5459 Other low back pain: Secondary | ICD-10-CM

## 2022-09-09 DIAGNOSIS — R293 Abnormal posture: Secondary | ICD-10-CM

## 2022-09-09 DIAGNOSIS — M6281 Muscle weakness (generalized): Secondary | ICD-10-CM

## 2022-09-09 DIAGNOSIS — M62838 Other muscle spasm: Secondary | ICD-10-CM

## 2022-09-09 NOTE — Therapy (Signed)
OUTPATIENT PHYSICAL THERAPY FEMALE PELVIC TREATMENT   Patient Name: Amber Kelley MRN: [id number] DOB:09-06-1985, 37 y.o., female Today's Date: 09/09/2022  END OF SESSION:  PT End of Session - 09/09/22 1321     Visit Number 5    Date for PT Re-Evaluation 11/04/22    Authorization Type [employer]    PT Start Time 1155    PT Stop Time 4268    PT Time Calculation (min) 40 min    Activity Tolerance Patient tolerated treatment well    Behavior During Therapy WFL for tasks assessed/performed                 Past Medical History:  Diagnosis Date   Anxiety    Fibroid    Hypothyroidism    Keratoconus of both eyes    Preeclampsia    Pregnancy induced hypertension    Past Surgical History:  Procedure Laterality Date   CESAREAN SECTION N/A 05/21/2022   Procedure: CESAREAN SECTION;  Surgeon: Drema Dallas, DO;  Location: [facility];  Service: Obstetrics;  Laterality: N/A;   EYE SURGERY Bilateral 2019   per patient "CORNEAL CROSS-LINKING"   MYOMECTOMY N/A 05/07/2020   Procedure: ABDOMINAL MYOMECTOMY;  Surgeon: Drema Dallas, DO;  Location: [place];  Service: Gynecology;  Laterality: N/A;   THYROIDECTOMY, PARTIAL  2012   Patient Active Problem List   Diagnosis Date Noted   History of myomectomy 05/20/2022   Fibroid uterus 05/02/2020   Hypothyroidism due to Hashimoto's thyroiditis 10/13/2017   Irritable bowel syndrome with diarrhea 10/13/2017   Allergic conjunctivitis of both eyes 07/26/2017   Myopia of both eyes with astigmatism 07/26/2017   Unstable keratoconus of both eyes 07/26/2017    [facility]  REFERRING PROVIDER: Drema Dallas, DO   REFERRING DIAG: N81.89 (ICD-10-CM) - Other female genital prolapse   THERAPY DIAG:  Other low back pain  Muscle weakness (generalized)  Other muscle spasm  Abnormal posture  Rationale for Evaluation and Treatment: Rehabilitation  ONSET DATE: after c-section 05/21/2022 things got worse, but had some things before  pregnancy  SUBJECTIVE:                                                                                                                                                                                           SUBJECTIVE STATEMENT: Pt did the massage a couple times but the skin feels so tight and painful - not having intercourse due to this.  Pt back was feeling better just started feeling sore today,.    Not breast feeding Fluid intake: Yes: 30 oz/day    PAIN:  Are you having pain? Yes NPRS  scale: 3/10 Pain location:  low back and hip joints  Pain type: aching, discomfort Pain description: intermittent   Aggravating factors: sitting on the ground or one position >30 min; to standing it feels like there is grinding in the hips Relieving factors: walking around feels better in the hips, back always hurts  PRECAUTIONS: None  WEIGHT BEARING RESTRICTIONS: No  FALLS:  Has patient fallen in last 6 months? No  LIVING ENVIRONMENT: Lives with: lives with their spouse and 4 children Lives in: House/apartment   OCCUPATION: in school   PLOF: Independent  PATIENT GOALS: get rid of pain and be able to work out with leaking  PERTINENT HISTORY:  C-section recent Sexual abuse: No  BOWEL MOVEMENT: Pain with bowel movement: No Type of bowel movement:Type (Bristol Stool Scale) normal, Frequency normal, and Strain Yes every so often with digestive issues - maybe 1/month Fully empty rectum: Yes:   Leakage: No Pads: No Fiber supplement: No  URINATION: Pain with urination: No Fully empty bladder: Yes:   Stream: Strong Urgency: No Frequency: normal Leakage: Coughing, Exercise, and when bladder is full Pads: Yes: 2 pads/day  INTERCOURSE: Pain with intercourse: Initial Penetration and During Penetration Ability to have vaginal penetration:  Yes: but haven't really had intercourse since postpartum  Marinoff Scale: 3/3  PREGNANCY:  C-section deliveries 1 (3 other children not  biological) Currently pregnant No  PROLAPSE: None   OBJECTIVE:   DIAGNOSTIC FINDINGS:    PATIENT SURVEYS:    PFIQ-7   COGNITION: Overall cognitive status: Within functional limits for tasks assessed     SENSATION: Light touch:  Proprioception:   MUSCLE LENGTH: Hamstrings: Right 80 deg; Left 70 deg Thomas test:  LUMBAR SPECIAL TESTS:  ASLR  FUNCTIONAL TESTS:  Single leg normal  GAIT:  Comments: pt was carrying baby in car seat  POSTURE: rounded shoulders, increased lumbar lordosis, increased thoracic kyphosis, and anterior pelvic tilt  PELVIC ALIGNMENT: left anterior rotation  LUMBARAROM/PROM:  A/PROM A/PROM  eval  Flexion WFL some pain when leaning fwd  Extension   Right lateral flexion WFL   Left lateral flexion WFL   Right rotation   Left rotation    (Blank rows = not tested)  LOWER EXTREMITY ROM:  P/ROM Right eval Left eval  Hip flexion 85% pain lateral hip 75% pain groin  Hip extension    Hip abduction    Hip adduction    Hip internal rotation WFL pain lateral hip 75% pain groin  Hip external rotation 50%   Knee flexion    Knee extension    Ankle dorsiflexion    Ankle plantarflexion    Ankle inversion    Ankle eversion     (Blank rows = not tested)  LOWER EXTREMITY MMT:  MMT Right eval Left eval  Hip flexion    Hip extension    Hip abduction 4-/5 4-/5  Hip adduction 4-/5 4-/5  Hip internal rotation    Hip external rotation    Knee flexion    Knee extension    Ankle dorsiflexion    Ankle plantarflexion    Ankle inversion    Ankle eversion     PALPATION:   General  tight Rt TFL and glutes, tight lumbar paraspinals                External Perineal Exam external muscle attachments tender to palpation  Internal Pelvic Floor Rt side more inflamed, bil levators and obturators tight and tender to palpation but Rt side more   Patient confirms identification and approves PT to assess internal pelvic  floor and treatment Yes  PELVIC MMT:   MMT eval  Vaginal 3/5, 5 reps, 1 x10sec hold  Internal Anal Sphincter   External Anal Sphincter   Puborectalis   Diastasis Recti 1-2 fingers at and above umbilicus  (Blank rows = not tested)        TONE: high  PROLAPSE: no  TODAY'S TREATMENT:                                                                                                                              DATE:  09/09/22  Theract: Dilator and vaginal moisturizer info and discussed working on soft tissue release more frequently  Manual: Lumbar and thoracic paraspinal STM Trigger Point Dry-Needling  Treatment instructions: Expect mild to moderate muscle soreness. S/S of pneumothorax if dry needled over a lung field, and to seek immediate medical attention should they occur. Patient verbalized understanding of these instructions and education.  Patient Consent Given: Yes Education handout provided: Yes Muscles treated: lumbar, thoracic multifidi Electrical stimulation performed: No Parameters: N/A Treatment response/outcome: increased soft tissue length palpated Patient confirms identification and approves physical therapist to perform internal soft tissue work- internal to introitus and levators Rt>Lt      PATIENT EDUCATION:  Education details: Access Code: [email or web address] Person educated: Patient Education method: Consulting civil engineer, Media planner, Verbal cues, and Handouts Education comprehension: verbalized understanding  HOME EXERCISE PROGRAM: Access Code: [email or web address] URL: [email or web address] Date: 08/25/2022 Prepared by: Jari Favre  Exercises - Supine Transversus Abdominis Bracing - Hands on Thighs  - 1 x daily - 7 x weekly - 1 sets - 10 reps - 5 sec hold - Sidelying Thoracic Rotation with Open Book  - 1 x daily - 7 x weekly - 1 sets - 5 reps - 10 sec hold - Supine Diaphragmatic Breathing  - 3 x daily - 7 x weekly - 1 sets - 10 reps - Supine Hip  Internal and External Rotation  - 1 x daily - 7 x weekly - 1 sets - 10 reps - 5 sec hold  ASSESSMENT:  CLINICAL IMPRESSION: Today's session focused on soft tissue release and educated on using moisturizers and dilator to stretch vaginal canal.  Pt will benefit from skilled PT to continue to progress strength and increased muscle length for improved functional mobility.  OBJECTIVE IMPAIRMENTS: decreased coordination, decreased endurance, decreased ROM, decreased strength, increased fascial restrictions, increased muscle spasms, impaired flexibility, impaired tone, postural dysfunction, and pain.   ACTIVITY LIMITATIONS: lifting, bending, squatting, toileting, caring for others, and sitting on the floor, exercises  PARTICIPATION LIMITATIONS: interpersonal relationship and community activity  PERSONAL FACTORS: 1 comorbidity: c-section and chronic pain that is exacerbated since pregnancy  are also affecting patient's functional outcome.   REHAB POTENTIAL: Excellent  CLINICAL DECISION MAKING: Evolving/moderate  complexity  EVALUATION COMPLEXITY: Moderate   GOALS: Goals reviewed with patient? Yes  SHORT TERM GOALS: Target date: 09/09/22 Updated 08/25/22  Ind with scar massage Baseline: Goal status: MET  2.  Ind with transversus abdominus activation on exhale Baseline:  Goal status: MET  3.  Pt will report 25% less pain Baseline: 3/10 down from 5/10 initially Goal status: MET    LONG TERM GOALS: Target date: 11/04/22  Pt will report 80% reduction of pain due to improvements in posture, strength, and muscle length  Baseline:  Goal status: IN PROGRESS  2.  Pt will be independent with advanced HEP to maintain improvements made throughout therapy  Baseline:  Goal status: IN PROGRESS  3.  Pt will have 1-2/3 score of Marinoff scale  Baseline: 3/3 Goal status: IN PROGRESS  4.  Pt will be able to correctly activate their core and lift 20 lb x 5 reps Baseline:  Goal status: IN  PROGRESS  5.  Pt will be able to sit on the ground without increased pain Baseline:  Goal status: IN PROGRESS   PLAN:  PT FREQUENCY: 1x/week  PT DURATION: 12 weeks  PLANNED INTERVENTIONS: Therapeutic exercises, Therapeutic activity, Neuromuscular re-education, Balance training, Gait training, Patient/Family education, Self Care, Joint mobilization, Aquatic Therapy, Dry Needling, Electrical stimulation, Cryotherapy, Moist heat, Taping, Biofeedback, Manual therapy, and Re-evaluation  PLAN FOR NEXT SESSION: transversus abdominus activation - progress core strength; f/u on dry needling #3; continue to work on pelvic floor lengthening and further educate on dilators if still tight, half kneel with band   [employer], PT 09/09/2022, 1:22 PM

## 2022-09-16 ENCOUNTER — Encounter: Payer: Self-pay | Admitting: Physical Therapy

## 2022-09-20 ENCOUNTER — Ambulatory Visit: Payer: Medicaid Other | Attending: Obstetrics and Gynecology | Admitting: Physical Therapy

## 2022-09-20 ENCOUNTER — Encounter: Payer: Self-pay | Admitting: Physical Therapy

## 2022-09-20 DIAGNOSIS — M5459 Other low back pain: Secondary | ICD-10-CM | POA: Diagnosis present

## 2022-09-20 DIAGNOSIS — M62838 Other muscle spasm: Secondary | ICD-10-CM | POA: Diagnosis present

## 2022-09-20 DIAGNOSIS — M6281 Muscle weakness (generalized): Secondary | ICD-10-CM | POA: Insufficient documentation

## 2022-09-20 DIAGNOSIS — R293 Abnormal posture: Secondary | ICD-10-CM | POA: Diagnosis present

## 2022-09-20 NOTE — Therapy (Signed)
OUTPATIENT PHYSICAL THERAPY FEMALE PELVIC TREATMENT   Patient Name: Amber Kelley MRN: [id number] DOB:18-Jul-1985, 37 y.o., female Today's Date: 09/20/2022  END OF SESSION:  PT End of Session - 09/20/22 0831     Visit Number 6    Date for PT Re-Evaluation 11/04/22    Authorization Type U[employer]    PT Start Time 0804    PT Stop Time [id number]    PT Time Calculation (min) 40 min    Activity Tolerance Patient tolerated treatment well    Behavior During Therapy [facility] for tasks assessed/performed                  Past Medical History:  Diagnosis Date   Anxiety    Fibroid    Hypothyroidism    Keratoconus of both eyes    Preeclampsia    Pregnancy induced hypertension    Past Surgical History:  Procedure Laterality Date   CESAREAN SECTION N/A 05/21/2022   Procedure: CESAREAN SECTION;  Surgeon: Drema Dallas, DO;  Location: [facility];  Service: Obstetrics;  Laterality: N/A;   EYE SURGERY Bilateral 2019   per patient "CORNEAL CROSS-LINKING"   MYOMECTOMY N/A 05/07/2020   Procedure: ABDOMINAL MYOMECTOMY;  Surgeon: Drema Dallas, DO;  Location: [place];  Service: Gynecology;  Laterality: N/A;   THYROIDECTOMY, PARTIAL  2012   Patient Active Problem List   Diagnosis Date Noted   History of myomectomy 05/20/2022   Fibroid uterus 05/02/2020   Hypothyroidism due to Hashimoto's thyroiditis 10/13/2017   Irritable bowel syndrome with diarrhea 10/13/2017   Allergic conjunctivitis of both eyes 07/26/2017   Myopia of both eyes with astigmatism 07/26/2017   Unstable keratoconus of both eyes 07/26/2017    [facility]  REFERRING PROVIDER: Drema Dallas, DO   REFERRING DIAG: N81.89 (ICD-10-CM) - Other female genital prolapse   THERAPY DIAG:  Muscle weakness (generalized)  Other muscle spasm  Other low back pain  Abnormal posture  Rationale for Evaluation and Treatment: Rehabilitation  ONSET DATE: after c-section 05/21/2022 things got worse, but had some things before  pregnancy  SUBJECTIVE:                                                                                                                                                                                           SUBJECTIVE STATEMENT: Pt states her back started hurting Thursday and is hurting more.  It felt good for a week since last time.  Should is also hurting from sleeping on it wrong.  Hurts when reaching back and reaching overhead.    Not breast feeding Fluid intake: Yes: 30 oz/day    PAIN:  Are you having pain? Yes NPRS scale: 4/10 Pain location:  low back and hip joints  Pain type: aching, discomfort Pain description: intermittent   Aggravating factors: sitting on the ground or one position >30 min; to standing it feels like there is grinding in the hips Relieving factors: walking around feels better in the hips, back always hurts  PRECAUTIONS: None  WEIGHT BEARING RESTRICTIONS: No  FALLS:  Has patient fallen in last 6 months? No  LIVING ENVIRONMENT: Lives with: lives with their spouse and 4 children Lives in: House/apartment   OCCUPATION: in school   PLOF: Independent  PATIENT GOALS: get rid of pain and be able to work out with leaking  PERTINENT HISTORY:  C-section recent Sexual abuse: No  BOWEL MOVEMENT: Pain with bowel movement: No Type of bowel movement:Type (Bristol Stool Scale) normal, Frequency normal, and Strain Yes every so often with digestive issues - maybe 1/month Fully empty rectum: Yes:   Leakage: No Pads: No Fiber supplement: No  URINATION: Pain with urination: No Fully empty bladder: Yes:   Stream: Strong Urgency: No Frequency: normal Leakage: Coughing, Exercise, and when bladder is full Pads: Yes: 2 pads/day  INTERCOURSE: Pain with intercourse: Initial Penetration and During Penetration Ability to have vaginal penetration:  Yes: but haven't really had intercourse since postpartum  Marinoff Scale: 3/3  PREGNANCY:  C-section  deliveries 1 (3 other children not biological) Currently pregnant No  PROLAPSE: None   OBJECTIVE:   DIAGNOSTIC FINDINGS:    PATIENT SURVEYS:    PFIQ-7   COGNITION: Overall cognitive status: Within functional limits for tasks assessed     SENSATION: Light touch:  Proprioception:   MUSCLE LENGTH: Hamstrings: Right 80 deg; Left 70 deg Thomas test:  LUMBAR SPECIAL TESTS:  ASLR  FUNCTIONAL TESTS:  Single leg normal  GAIT:  Comments: pt was carrying baby in car seat  POSTURE: rounded shoulders, increased lumbar lordosis, increased thoracic kyphosis, and anterior pelvic tilt  PELVIC ALIGNMENT: left anterior rotation  LUMBARAROM/PROM:  A/PROM A/PROM  eval  Flexion WFL some pain when leaning fwd  Extension   Right lateral flexion WFL   Left lateral flexion WFL   Right rotation   Left rotation    (Blank rows = not tested)  LOWER EXTREMITY ROM:  P/ROM Right eval Left eval  Hip flexion 85% pain lateral hip 75% pain groin  Hip extension    Hip abduction    Hip adduction    Hip internal rotation WFL pain lateral hip 75% pain groin  Hip external rotation 50%   Knee flexion    Knee extension    Ankle dorsiflexion    Ankle plantarflexion    Ankle inversion    Ankle eversion     (Blank rows = not tested)  LOWER EXTREMITY MMT:  MMT Right eval Left eval R/L 09/20/22   Hip flexion     Hip extension   4+/4+  Hip abduction 4-/5 4-/5 4+/4-  Hip adduction 4-/5 4-/5   Hip internal rotation     Hip external rotation     Knee flexion     Knee extension     Ankle dorsiflexion     Ankle plantarflexion     Ankle inversion     Ankle eversion      PALPATION:   General  tight Rt TFL and glutes, tight lumbar paraspinals                External Perineal Exam external muscle attachments tender to palpation  Internal Pelvic Floor Rt side more inflamed, bil levators and obturators tight and tender to palpation but Rt side more    Patient confirms identification and approves PT to assess internal pelvic floor and treatment Yes  PELVIC MMT:   MMT eval  Vaginal 3/5, 5 reps, 1 x10sec hold  Internal Anal Sphincter   External Anal Sphincter   Puborectalis   Diastasis Recti 1-2 fingers at and above umbilicus  (Blank rows = not tested)        TONE: high  PROLAPSE: no  TODAY'S TREATMENT:                                                                                                                              DATE:  09/20/22  Exercise: Side lying hip abduction 2x10 bil and added to HEP  Manual: Lumbar and thoracic paraspinal STM, gluteals and hip flexor Trigger Point Dry-Needling  Treatment instructions: Expect mild to moderate muscle soreness. S/S of pneumothorax if dry needled over a lung field, and to seek immediate medical attention should they occur. Patient verbalized understanding of these instructions and education.  Patient Consent Given: Yes Education handout provided: Yes Muscles treated: lumbar, thoracic multifidi, bil glute med and hip flexors Electrical stimulation performed: No Parameters: N/A Treatment response/outcome: increased soft tissue length palpated Patient confirms identification and approves physical therapist to perform internal soft tissue work- internal to introitus and levators Rt>Lt      PATIENT EDUCATION:  Education details: Access Code: [email or web address] Person educated: Patient Education method: Consulting civil engineer, Media planner, Verbal cues, and Handouts Education comprehension: verbalized understanding  HOME EXERCISE PROGRAM: Access Code: [email or web address] URL: [email or web address] Date: 08/25/2022 Prepared by: Jari Favre  Exercises - Supine Transversus Abdominis Bracing - Hands on Thighs  - 1 x daily - 7 x weekly - 1 sets - 10 reps - 5 sec hold - Sidelying Thoracic Rotation with Open Book  - 1 x daily - 7 x weekly - 1 sets - 5 reps - 10 sec hold - Supine  Diaphragmatic Breathing  - 3 x daily - 7 x weekly - 1 sets - 10 reps - Supine Hip Internal and External Rotation  - 1 x daily - 7 x weekly - 1 sets - 10 reps - 5 sec hold  ASSESSMENT:  CLINICAL IMPRESSION: Today's session focused on soft tissue release.  Pt was re-assessed in hip strength.  Minor improvement with Rt hip abduction but still 4-/5 MMT on left.  Pt was given side lying hip abduction in HEP.  Pt is feeling more loose after soft tissue release.  Pelvic floor released some with bimanual stretch to scar tissue which is more on the left of introitis.  Pt will benefit from skilled PT to continue working on core strength  OBJECTIVE IMPAIRMENTS: decreased coordination, decreased endurance, decreased ROM, decreased strength, increased fascial restrictions, increased muscle spasms, impaired flexibility, impaired tone, postural dysfunction, and pain.   ACTIVITY LIMITATIONS: lifting, bending, squatting, toileting, caring  for others, and sitting on the floor, exercises  PARTICIPATION LIMITATIONS: interpersonal relationship and community activity  PERSONAL FACTORS: 1 comorbidity: c-section and chronic pain that is exacerbated since pregnancy  are also affecting patient's functional outcome.   REHAB POTENTIAL: Excellent  CLINICAL DECISION MAKING: Evolving/moderate complexity  EVALUATION COMPLEXITY: Moderate   GOALS: Goals reviewed with patient? Yes  SHORT TERM GOALS: Target date: 09/09/22 Updated 08/25/22  Ind with scar massage Baseline: Goal status: MET  2.  Ind with transversus abdominus activation on exhale Baseline:  Goal status: MET  3.  Pt will report 25% less pain Baseline: 3/10 down from 5/10 initially Goal status: MET    LONG TERM GOALS: Target date: 11/04/22  Pt will report 80% reduction of pain due to improvements in posture, strength, and muscle length  Baseline:  Goal status: IN PROGRESS  2.  Pt will be independent with advanced HEP to maintain improvements  made throughout therapy  Baseline:  Goal status: IN PROGRESS  3.  Pt will have 1-2/3 score of Marinoff scale  Baseline: 3/3 Goal status: IN PROGRESS  4.  Pt will be able to correctly activate their core and lift 20 lb x 5 reps Baseline:  Goal status: IN PROGRESS  5.  Pt will be able to sit on the ground without increased pain Baseline:  Goal status: IN PROGRESS   PLAN:  PT FREQUENCY: 1x/week  PT DURATION: 12 weeks  PLANNED INTERVENTIONS: Therapeutic exercises, Therapeutic activity, Neuromuscular re-education, Balance training, Gait training, Patient/Family education, Self Care, Joint mobilization, Aquatic Therapy, Dry Needling, Electrical stimulation, Cryotherapy, Moist heat, Taping, Biofeedback, Manual therapy, and Re-evaluation  PLAN FOR NEXT SESSION: transversus abdominus activation - progress core strength; f/u on dry needling #4; f/u on self stretch/massage half kneel with band   [employer], PT 09/20/2022, 8:31 AM

## 2022-09-28 ENCOUNTER — Ambulatory Visit: Payer: Medicaid Other | Admitting: Physical Therapy

## 2022-10-12 ENCOUNTER — Ambulatory Visit: Payer: Medicaid Other | Admitting: Physical Therapy

## 2022-10-12 DIAGNOSIS — R293 Abnormal posture: Secondary | ICD-10-CM

## 2022-10-12 DIAGNOSIS — M6281 Muscle weakness (generalized): Secondary | ICD-10-CM | POA: Diagnosis not present

## 2022-10-12 DIAGNOSIS — M5459 Other low back pain: Secondary | ICD-10-CM

## 2022-10-12 DIAGNOSIS — M62838 Other muscle spasm: Secondary | ICD-10-CM

## 2022-10-12 NOTE — Therapy (Signed)
OUTPATIENT PHYSICAL THERAPY FEMALE PELVIC TREATMENT   Patient Name: Fable Huisman MRN: [id number] DOB:October 02, 1985, 37 y.o., female Today's Date: 10/12/2022  END OF SESSION:  PT End of Session - 10/12/22 0857     Visit Number 7    Date for PT Re-Evaluation 11/04/22    Authorization Type [employer]    PT Start Time 0853    PT Stop Time 0932    PT Time Calculation (min) 39 min    Activity Tolerance Patient tolerated treatment well    Behavior During Therapy [facility] for tasks assessed/performed                   Past Medical History:  Diagnosis Date   Anxiety    Fibroid    Hypothyroidism    Keratoconus of both eyes    Preeclampsia    Pregnancy induced hypertension    Past Surgical History:  Procedure Laterality Date   CESAREAN SECTION N/A 05/21/2022   Procedure: CESAREAN SECTION;  Surgeon: [name], DO;  Location: [facility] LD ORS;  Service: Obstetrics;  Laterality: N/A;   EYE SURGERY Bilateral 2019   per patient "CORNEAL CROSS-LINKING"   MYOMECTOMY N/A 05/07/2020   Procedure: ABDOMINAL MYOMECTOMY;  Surgeon: [name], DO;  Location: [facility] OR;  Service: Gynecology;  Laterality: N/A;   THYROIDECTOMY, PARTIAL  2012   Patient Active Problem List   Diagnosis Date Noted   History of myomectomy 05/20/2022   Fibroid uterus 05/02/2020   Hypothyroidism due to Hashimoto's thyroiditis 10/13/2017   Irritable bowel syndrome with diarrhea 10/13/2017   Allergic conjunctivitis of both eyes 07/26/2017   Myopia of both eyes with astigmatism 07/26/2017   Unstable keratoconus of both eyes 07/26/2017    PCP: [facility]  REFERRING PROVIDER: [name], DO   REFERRING DIAG: N81.89 (ICD-10-CM) - Other female genital prolapse   THERAPY DIAG:  Muscle weakness (generalized)  Other muscle spasm  Other low back pain  Abnormal posture  Rationale for Evaluation and Treatment: Rehabilitation  ONSET DATE: after c-section 05/21/2022 things got worse, but had some things  before pregnancy  SUBJECTIVE:                                                                                                                                                                                           SUBJECTIVE STATEMENT: Back and hips are hurting more now because my periord has been really heavy and not stopping.    Not breast feeding Fluid intake: Yes: 30 oz/day    PAIN:  Are you having pain? Yes NPRS scale: 5/10 Pain location:  low back and hip joints  Pain type: aching,  discomfort Pain description: intermittent   Aggravating factors: being on period Relieving factors: heat  PRECAUTIONS: None  WEIGHT BEARING RESTRICTIONS: No  FALLS:  Has patient fallen in last 6 months? No  LIVING ENVIRONMENT: Lives with: lives with their spouse and 4 children Lives in: House/apartment   OCCUPATION: in school   PLOF: Independent  PATIENT GOALS: get rid of pain and be able to work out with leaking  PERTINENT HISTORY:  C-section recent Sexual abuse: No  BOWEL MOVEMENT: Pain with bowel movement: No Type of bowel movement:Type (Bristol Stool Scale) normal, Frequency normal, and Strain Yes every so often with digestive issues - maybe 1/month Fully empty rectum: Yes:   Leakage: No Pads: No Fiber supplement: No  URINATION: Pain with urination: No Fully empty bladder: Yes:   Stream: Strong Urgency: No Frequency: normal Leakage: Coughing, Exercise, and when bladder is full Pads: Yes: 2 pads/day  INTERCOURSE: Pain with intercourse: Initial Penetration and During Penetration Ability to have vaginal penetration:  Yes: but haven't really had intercourse since postpartum  Marinoff Scale: 3/3  PREGNANCY:  C-section deliveries 1 (3 other children not biological) Currently pregnant No  PROLAPSE: None   OBJECTIVE:   DIAGNOSTIC FINDINGS:    PATIENT SURVEYS:    PFIQ-7   COGNITION: Overall cognitive status: Within functional limits for tasks  assessed     SENSATION: Light touch:  Proprioception:   MUSCLE LENGTH: Hamstrings: Right 80 deg; Left 70 deg Thomas test:  LUMBAR SPECIAL TESTS:  ASLR  FUNCTIONAL TESTS:  Single leg normal  GAIT:  Comments: pt was carrying baby in car seat  POSTURE: rounded shoulders, increased lumbar lordosis, increased thoracic kyphosis, and anterior pelvic tilt  PELVIC ALIGNMENT: left anterior rotation  LUMBARAROM/PROM:  A/PROM A/PROM  eval  Flexion WFL some pain when leaning fwd  Extension   Right lateral flexion WFL   Left lateral flexion WFL   Right rotation   Left rotation    (Blank rows = not tested)  LOWER EXTREMITY ROM:  P/ROM Right eval Left eval  Hip flexion 85% pain lateral hip 75% pain groin  Hip extension    Hip abduction    Hip adduction    Hip internal rotation WFL pain lateral hip 75% pain groin  Hip external rotation 50%   Knee flexion    Knee extension    Ankle dorsiflexion    Ankle plantarflexion    Ankle inversion    Ankle eversion     (Blank rows = not tested)  LOWER EXTREMITY MMT:  MMT Right eval Left eval R/L 09/20/22   Hip flexion     Hip extension   4+/4+  Hip abduction 4-/5 4-/5 4+/4-  Hip adduction 4-/5 4-/5   Hip internal rotation     Hip external rotation     Knee flexion     Knee extension     Ankle dorsiflexion     Ankle plantarflexion     Ankle inversion     Ankle eversion      PALPATION:   General  tight Rt TFL and glutes, tight lumbar paraspinals                External Perineal Exam external muscle attachments tender to palpation                             Internal Pelvic Floor Rt side more inflamed, bil levators and obturators tight and tender to palpation but Rt  side more   Patient confirms identification and approves PT to assess internal pelvic floor and treatment Yes  PELVIC MMT:   MMT eval  Vaginal 3/5, 5 reps, 1 x10sec hold  Internal Anal Sphincter   External Anal Sphincter   Puborectalis   Diastasis  Recti 1-2 fingers at and above umbilicus  (Blank rows = not tested)        TONE: high  PROLAPSE: no  TODAY'S TREATMENT:                                                                                                                              DATE:  10/12/22  NMRE: Standing at wall - breathing with posture cues Squat to wall keeping back flat Standing with back flat and keeping core engaged UE overhead  Manual: Lumbar and thoracic paraspinal STM, gluteals and hip flexor Trigger Point Dry-Needling  Treatment instructions: Expect mild to moderate muscle soreness. S/S of pneumothorax if dry needled over a lung field, and to seek immediate medical attention should they occur. Patient verbalized understanding of these instructions and education.  Patient Consent Given: Yes Education handout provided: Yes Muscles treated: lumbar, thoracic multifidi, bil glute med and hip flexors Electrical stimulation performed: No Parameters: N/A Treatment response/outcome: increased soft tissue length palpated Patient confirms identification and approves physical therapist to perform internal soft tissue work- internal to introitus and levators Rt>Lt      PATIENT EDUCATION:  Education details: Access Code: [email or web address] Person educated: Patient Education method: Programmer, multimedia, Facilities manager, Verbal cues, and Handouts Education comprehension: verbalized understanding  HOME EXERCISE PROGRAM: Access Code: [email or web address] URL: [email or web address] Date: 08/25/2022 Prepared by: Dwana Curd  Exercises - Supine Transversus Abdominis Bracing - Hands on Thighs  - 1 x daily - 7 x weekly - 1 sets - 10 reps - 5 sec hold - Sidelying Thoracic Rotation with Open Book  - 1 x daily - 7 x weekly - 1 sets - 5 reps - 10 sec hold - Supine Diaphragmatic Breathing  - 3 x daily - 7 x weekly - 1 sets - 10 reps - Supine Hip Internal and External Rotation  - 1 x daily - 7 x weekly - 1 sets - 10 reps - 5  sec hold  ASSESSMENT:  CLINICAL IMPRESSION: Today's session focused on soft tissue release and cont progression of posture strength for improved ability to maintain improvements achieved in session.  Pt feeling overall 40-50% better.   Pt will benefit from skilled PT to continue working on core strength  OBJECTIVE IMPAIRMENTS: decreased coordination, decreased endurance, decreased ROM, decreased strength, increased fascial restrictions, increased muscle spasms, impaired flexibility, impaired tone, postural dysfunction, and pain.   ACTIVITY LIMITATIONS: lifting, bending, squatting, toileting, caring for others, and sitting on the floor, exercises  PARTICIPATION LIMITATIONS: interpersonal relationship and community activity  PERSONAL FACTORS: 1 comorbidity: c-section and chronic pain that is exacerbated since pregnancy  are also affecting patient's functional outcome.   REHAB POTENTIAL:  Excellent  CLINICAL DECISION MAKING: Evolving/moderate complexity  EVALUATION COMPLEXITY: Moderate   GOALS: Goals reviewed with patient? Yes  SHORT TERM GOALS: Target date: 09/09/22 Updated 08/25/22  Ind with scar massage Baseline: Goal status: MET  2.  Ind with transversus abdominus activation on exhale Baseline:  Goal status: MET  3.  Pt will report 25% less pain Baseline: 3/10 down from 5/10 initially Goal status: MET    LONG TERM GOALS: Target date: 11/04/22  Pt will report 80% reduction of pain due to improvements in posture, strength, and muscle length  Baseline: 40-50% better  Goal status: IN PROGRESS  2.  Pt will be independent with advanced HEP to maintain improvements made throughout therapy  Baseline:  Goal status: IN PROGRESS  3.  Pt will have 1-2/3 score of Marinoff scale  Baseline: 3/3 Goal status: IN PROGRESS  4.  Pt will be able to correctly activate their core and lift 20 lb x 5 reps Baseline:  Goal status: IN PROGRESS  5.  Pt will be able to sit on the ground  without increased pain Baseline:  Goal status: IN PROGRESS   PLAN:  PT FREQUENCY: 1x/week  PT DURATION: 12 weeks  PLANNED INTERVENTIONS: Therapeutic exercises, Therapeutic activity, Neuromuscular re-education, Balance training, Gait training, Patient/Family education, Self Care, Joint mobilization, Aquatic Therapy, Dry Needling, Electrical stimulation, Cryotherapy, Moist heat, Taping, Biofeedback, Manual therapy, and Re-evaluation  PLAN FOR NEXT SESSION: transversus abdominus activation - progress core strength; f/u on dry needling #5; f/u on self stretch/massage half kneel with band   [employer], PT 10/12/2022, 9:04 AM

## 2022-10-19 ENCOUNTER — Encounter: Payer: Self-pay | Admitting: Physical Therapy

## 2022-10-19 ENCOUNTER — Ambulatory Visit: Payer: Medicaid Other | Admitting: Physical Therapy

## 2022-10-19 DIAGNOSIS — M6281 Muscle weakness (generalized): Secondary | ICD-10-CM

## 2022-10-19 DIAGNOSIS — R293 Abnormal posture: Secondary | ICD-10-CM

## 2022-10-19 DIAGNOSIS — M62838 Other muscle spasm: Secondary | ICD-10-CM

## 2022-10-19 DIAGNOSIS — M5459 Other low back pain: Secondary | ICD-10-CM

## 2022-10-19 NOTE — Therapy (Signed)
OUTPATIENT PHYSICAL THERAPY FEMALE PELVIC TREATMENT   Patient Name: Amber Kelley MRN: [id number] DOB:08/31/1985, 37 y.o., female Today's Date: 10/19/2022  END OF SESSION:  PT End of Session - 10/19/22 0852     Visit Number 8    Date for PT Re-Evaluation 11/04/22    Authorization Type U[employer]    PT Start Time 0843    PT Stop Time 0922    PT Time Calculation (min) 39 min    Activity Tolerance Patient tolerated treatment well    Behavior During Therapy [facility] for tasks assessed/performed                    Past Medical History:  Diagnosis Date   Anxiety    Fibroid    Hypothyroidism    Keratoconus of both eyes    Preeclampsia    Pregnancy induced hypertension    Past Surgical History:  Procedure Laterality Date   CESAREAN SECTION N/A 05/21/2022   Procedure: CESAREAN SECTION;  Surgeon: [name], DO;  Location: [facility] LD [facility]S;  Service: Obstetrics;  Laterality: N/A;   EYE SURGERY Bilateral 2019   per patient "CORNEAL CROSS-LINKING"   MYOMECTOMY N/A 05/07/2020   Procedure: ABDOMINAL MYOMECTOMY;  Surgeon: [name], DO;  Location: [facility] OR;  Service: Gynecology;  Laterality: N/A;   THYROIDECTOMY, PARTIAL  2012   Patient Active Problem List   Diagnosis Date Noted   History of myomectomy 05/20/2022   Fibroid uterus 05/02/2020   Hypothyroidism due to Hashimoto's thyroiditis 10/13/2017   Irritable bowel syndrome with diarrhea 10/13/2017   Allergic conjunctivitis of both eyes 07/26/2017   Myopia of both eyes with astigmatism 07/26/2017   Unstable keratoconus of both eyes 07/26/2017    PCP: [facility]  REFERRING PROVIDER: [name], DO   REFERRING DIAG: N81.89 (ICD-10-CM) - Other female genital prolapse   THERAPY DIAG:  Muscle weakness (generalized)  Other muscle spasm  Other low back pain  Abnormal posture  Rationale for Evaluation and Treatment: Rehabilitation  ONSET DATE: after c-section 05/21/2022 things got worse, but had some things  before pregnancy  SUBJECTIVE:                                                                                                                                                                                           SUBJECTIVE STATEMENT: Back in the center is just tight and stiff.  My period is still ongoing and will get a procedure to scrape out the lining.   Not breast feeding Fluid intake: Yes: 30 oz/day    PAIN:  Are you having pain? Yes NPRS scale: 5/10 Pain location:  low  back and hip joints  Pain type: aching, discomfort Pain description: intermittent   Aggravating factors: being on period Relieving factors: heat  PRECAUTIONS: None  WEIGHT BEARING RESTRICTIONS: No  FALLS:  Has patient fallen in last 6 months? No  LIVING ENVIRONMENT: Lives with: lives with their spouse and 4 children Lives in: House/apartment   OCCUPATION: in school   PLOF: Independent  PATIENT GOALS: get rid of pain and be able to work out with leaking  PERTINENT HISTORY:  C-section recent Sexual abuse: No  BOWEL MOVEMENT: Pain with bowel movement: No Type of bowel movement:Type (Bristol Stool Scale) normal, Frequency normal, and Strain Yes every so often with digestive issues - maybe 1/month Fully empty rectum: Yes:   Leakage: No Pads: No Fiber supplement: No  URINATION: Pain with urination: No Fully empty bladder: Yes:   Stream: Strong Urgency: No Frequency: normal Leakage: Coughing, Exercise, and when bladder is full Pads: Yes: 2 pads/day  INTERCOURSE: Pain with intercourse: Initial Penetration and During Penetration Ability to have vaginal penetration:  Yes: but haven't really had intercourse since postpartum  Marinoff Scale: 3/3  PREGNANCY:  C-section deliveries 1 (3 other children not biological) Currently pregnant No  PROLAPSE: None   OBJECTIVE:   DIAGNOSTIC FINDINGS:    PATIENT SURVEYS:    PFIQ-7   COGNITION: Overall cognitive status: Within  functional limits for tasks assessed     SENSATION: Light touch:  Proprioception:   MUSCLE LENGTH: Hamstrings: Right 80 deg; Left 70 deg Thomas test:  LUMBAR SPECIAL TESTS:  ASLR  FUNCTIONAL TESTS:  Single leg normal  GAIT:  Comments: pt was carrying baby in car seat  POSTURE: rounded shoulders, increased lumbar lordosis, increased thoracic kyphosis, and anterior pelvic tilt  PELVIC ALIGNMENT: left anterior rotation  LUMBARAROM/PROM:  A/PROM A/PROM  eval  Flexion WFL some pain when leaning fwd  Extension   Right lateral flexion WFL   Left lateral flexion WFL   Right rotation   Left rotation    (Blank rows = not tested)  LOWER EXTREMITY ROM:  P/ROM Right eval Left eval  Hip flexion 85% pain lateral hip 75% pain groin  Hip extension    Hip abduction    Hip adduction    Hip internal rotation WFL pain lateral hip 75% pain groin  Hip external rotation 50%   Knee flexion    Knee extension    Ankle dorsiflexion    Ankle plantarflexion    Ankle inversion    Ankle eversion     (Blank rows = not tested)  LOWER EXTREMITY MMT:  MMT Right eval Left eval R/L 09/20/22   Hip flexion     Hip extension   4+/4+  Hip abduction 4-/5 4-/5 4+/4-  Hip adduction 4-/5 4-/5   Hip internal rotation     Hip external rotation     Knee flexion     Knee extension     Ankle dorsiflexion     Ankle plantarflexion     Ankle inversion     Ankle eversion      PALPATION:   General  tight Rt TFL and glutes, tight lumbar paraspinals                External Perineal Exam external muscle attachments tender to palpation                             Internal Pelvic Floor Rt side more inflamed, bil levators and  obturators tight and tender to palpation but Rt side more   Patient confirms identification and approves PT to assess internal pelvic floor and treatment Yes  PELVIC MMT:   MMT eval  Vaginal 3/5, 5 reps, 1 x10sec hold  Internal Anal Sphincter   External Anal Sphincter    Puborectalis   Diastasis Recti 1-2 fingers at and above umbilicus  (Blank rows = not tested)        TONE: high  PROLAPSE: no  TODAY'S TREATMENT:                                                                                                                              DATE:  10/19/22  NMRE: Pball - UE/LE and both together -keeping core engaged Half kneel rotation tband  Manual: Lumbar and thoracic paraspinal STM, gluteals and hip flexor Trigger Point Dry-Needling  Treatment instructions: Expect mild to moderate muscle soreness. S/S of pneumothorax if dry needled over a lung field, and to seek immediate medical attention should they occur. Patient verbalized understanding of these instructions and education.  Patient Consent Given: Yes Education handout provided: Yes Muscles treated: lumbar, thoracic multifidi,  Electrical stimulation performed: No Parameters: N/A Treatment response/outcome: increased soft tissue length palpated Patient confirms identification and approves physical therapist to perform internal soft tissue work- internal to introitus and levators Rt>Lt      PATIENT EDUCATION:  Education details: Access Code: [email or web address] Person educated: Patient Education method: Programmer, multimedia, Facilities manager, Verbal cues, and Handouts Education comprehension: verbalized understanding  HOME EXERCISE PROGRAM: Access Code: [email or web address] URL: [email or web address] Date: 08/25/2022 Prepared by: Dwana Curd  Exercises - Supine Transversus Abdominis Bracing - Hands on Thighs  - 1 x daily - 7 x weekly - 1 sets - 10 reps - 5 sec hold - Sidelying Thoracic Rotation with Open Book  - 1 x daily - 7 x weekly - 1 sets - 5 reps - 10 sec hold - Supine Diaphragmatic Breathing  - 3 x daily - 7 x weekly - 1 sets - 10 reps - Supine Hip Internal and External Rotation  - 1 x daily - 7 x weekly - 1 sets - 10 reps - 5 sec hold  ASSESSMENT:  CLINICAL IMPRESSION: Today's  session focused on soft tissue release and cont progression of posture strength for improved ability to maintain improvements achieved in session.  Pt able to have intercourse and is much better but still having issue with period preventing much internal vaginal penetration and also unable to tolerate internal re-assessment today.   Pt will benefit from skilled PT to continue working on core strength  OBJECTIVE IMPAIRMENTS: decreased coordination, decreased endurance, decreased ROM, decreased strength, increased fascial restrictions, increased muscle spasms, impaired flexibility, impaired tone, postural dysfunction, and pain.   ACTIVITY LIMITATIONS: lifting, bending, squatting, toileting, caring for others, and sitting on the floor, exercises  PARTICIPATION LIMITATIONS: interpersonal relationship and community activity  PERSONAL FACTORS: 1 comorbidity: c-section and chronic pain that  is exacerbated since pregnancy  are also affecting patient's functional outcome.   REHAB POTENTIAL: Excellent  CLINICAL DECISION MAKING: Evolving/moderate complexity  EVALUATION COMPLEXITY: Moderate   GOALS: Goals reviewed with patient? Yes  SHORT TERM GOALS: Target date: 09/09/22 Updated 08/25/22  Ind with scar massage Baseline: Goal status: MET  2.  Ind with transversus abdominus activation on exhale Baseline:  Goal status: MET  3.  Pt will report 25% less pain Baseline: 3/10 down from 5/10 initially Goal status: MET    LONG TERM GOALS: Target date: 11/04/22  Pt will report 80% reduction of pain due to improvements in posture, strength, and muscle length  Baseline: 40-50% better  Goal status: IN PROGRESS  2.  Pt will be independent with advanced HEP to maintain improvements made throughout therapy  Baseline:  Goal status: IN PROGRESS  3.  Pt will have 1-2/3 score of Marinoff scale  Baseline: 3/3 Goal status: IN PROGRESS  4.  Pt will be able to correctly activate their core and lift 20 lb  x 5 reps Baseline:  Goal status: IN PROGRESS  5.  Pt will be able to sit on the ground without increased pain Baseline:  Goal status: IN PROGRESS   PLAN:  PT FREQUENCY: 1x/week  PT DURATION: 12 weeks  PLANNED INTERVENTIONS: Therapeutic exercises, Therapeutic activity, Neuromuscular re-education, Balance training, Gait training, Patient/Family education, Self Care, Joint mobilization, Aquatic Therapy, Dry Needling, Electrical stimulation, Cryotherapy, Moist heat, Taping, Biofeedback, Manual therapy, and Re-evaluation  PLAN FOR NEXT SESSION: dry needling #6 thoracic and lumbar and maybe gluteals and hip flexors as needed, internal STM and re-assess as able; f/u on self stretch perineal massage, half kneel with band   [employer], PT 10/19/2022, 8:53 AM

## 2022-10-26 ENCOUNTER — Encounter: Payer: Self-pay | Admitting: Physical Therapy

## 2022-10-26 ENCOUNTER — Ambulatory Visit: Payer: Medicaid Other | Attending: Obstetrics and Gynecology | Admitting: Physical Therapy

## 2022-10-26 ENCOUNTER — Encounter (HOSPITAL_BASED_OUTPATIENT_CLINIC_OR_DEPARTMENT_OTHER): Payer: Self-pay | Admitting: Obstetrics and Gynecology

## 2022-10-26 DIAGNOSIS — M62838 Other muscle spasm: Secondary | ICD-10-CM

## 2022-10-26 DIAGNOSIS — R293 Abnormal posture: Secondary | ICD-10-CM | POA: Diagnosis present

## 2022-10-26 DIAGNOSIS — M5459 Other low back pain: Secondary | ICD-10-CM

## 2022-10-26 DIAGNOSIS — M6281 Muscle weakness (generalized): Secondary | ICD-10-CM | POA: Diagnosis present

## 2022-10-26 NOTE — H&P (Signed)
Amber Kelley is an 37 y.o. [id number] who is admitted for Hysteroscopy with Dilation and Curettage with Myosure for AUB and thickened endometrium on ultrasound (1.48cm).  Patient has a history of abdominal myomectomy for large uterine fibroids (05/07/2020) and subsequently at cesarean section (05/21/2022). Patient reports heavy menses at the end of March associated with light-headedness. Since delivery, she has only had one other period 07/2022 which was light and lasted 4 days. Patient was prescribed MPA 20 mg TID x 7 days but due to issues w/ pharmacy, patient never took.Bleeding stopped 10/05/2022. She had return of menses and has currently been a week without any bleeding. Patient was offered [facility] in the office but declines and desires hysteroscopic evaluation.  Work-up:  GC/CT 05/18/2022 negative TSH 05/04/2022 WNL Quant HCG negative 09/29/2022 CBC/Ferritin WNL 09/29/2022  TVUS (10/07/22): Uterus 11.40 x 6.92 x 6.60cm Endometrial thickness 1.48cm Multiple fibroids (6 measurable) ranging 1-2cm Right ovary 3.97cm  Left ovary 2.29cm Anteverted uterus. Small uterine fibroids - posterior fundal 2.2 x 2.1 x 1.8cm, posterior 1.8 x 1.7 x 1.1cm, left 0.9 x 0.8cm, anterior 1.1 x 0.8cm, anterior 1.2 x 1.0cm. Endometrium thickened. Right ovary - simple cyst 2.9 x 2.8 x 2.6cm, avascular. Left ovary wnl. No adnexal masses seen.  Patient Active Problem List   Diagnosis Date Noted   History of myomectomy 05/20/2022   Fibroid uterus 05/02/2020   Hypothyroidism due to Hashimoto's thyroiditis 10/13/2017   Irritable bowel syndrome with diarrhea 10/13/2017   Allergic conjunctivitis of both eyes 07/26/2017   Myopia of both eyes with astigmatism 07/26/2017   Unstable keratoconus of both eyes 07/26/2017    MEDICAL/FAMILY/SOCIAL HX: No LMP recorded.    Past Medical History:  Diagnosis Date   Anxiety    Fibroid    Hypothyroidism    Keratoconus of both eyes    Preeclampsia    Pregnancy induced  hypertension     Past Surgical History:  Procedure Laterality Date   CESAREAN SECTION N/A 05/21/2022   Procedure: CESAREAN SECTION;  Surgeon: [name], DO;  Location: [facility] LD ORS;  Service: Obstetrics;  Laterality: N/A;   EYE SURGERY Bilateral 2019   per patient "CORNEAL CROSS-LINKING"   MYOMECTOMY N/A 05/07/2020   Procedure: ABDOMINAL MYOMECTOMY;  Surgeon: [name], DO;  Location: [facility] OR;  Service: Gynecology;  Laterality: N/A;   THYROIDECTOMY, PARTIAL  2012    Family History  Family history unknown: Yes    Social History:  reports that she has never smoked. She has never used smokeless tobacco. She reports that she does not currently use alcohol. She reports that she does not use drugs.  ALLERGIES/MEDS:  Allergies:  Allergies  Allergen Reactions   Banana Other (See Comments)    Internal pain    No medications prior to admission.     Review of Systems  Constitutional: Negative.   HENT: Negative.    Eyes: Negative.   Respiratory: Negative.    Cardiovascular: Negative.   Gastrointestinal: Negative.   Musculoskeletal: Negative.   Skin: Negative.   Neurological: Negative.   Endo/Heme/Allergies: Negative.   Psychiatric/Behavioral: Negative.      not currently breastfeeding. Gen:  NAD, pleasant and cooperative Cardio:  RRR Pulm:  CTAB, no wheezes/rales/rhonchi Abd:  Soft, non-distended, non-tender throughout, no rebound/guarding Ext:  No bilateral LE edema, no bilateral calf tenderness  No results found for this or any previous visit (from the past 24 hour(s)).  No results found.   ASSESSMENT/PLAN: Amber Kelley is a 37 y.o. [id number] who is admitted  for Hysteroscopy with Dilation and Curettage with Myosure for AUB and thickened endometrium on ultrasound (1.48cm).  - Admit to [facility] - Admit labs (CBC, T&S) - Diet:  Per anesthesia/ERAS pathway - IVF:  Per anesthesia - VTE Prophylaxis:  SCDs - Antibiotics: None - D/C home same-day  Consents: I  discussed with the patient that this surgery is performed to look inside the uterus and remove the uterine lining.  Prior to surgery, the risks and benefits of the surgery, as well as alternative treatments, have been discussed.  The risks include, but are not limited to bleeding, including the need for a blood transfusion, infection, damage to organs and tissues, including uterine perforation, requiring additional surgery, postoperative pain, short-term and long-term, failure of the procedure to control symptoms, need for hysterectomy to control bleeding, fluid overload, which could create electrolyte abnormalities and the need to stop the procedure before completion, inability to safely complete the procedure, deep vein thrombosis and/or pulmonary embolism, painful intercourse, complications the course of which cannot be predicted or prevented, and death.  Patient was consented for blood products.  The patient is aware that bleeding may result in the need for a blood transfusion which includes risk of transmission of HIV (1:2 million), Hepatitis C (1:2 million), and Hepatitis B (1:200 thousand) and transfusion reaction.  Patient voiced understanding of the above risks as well as understanding of indications for blood transfusion.  [name], DO

## 2022-10-26 NOTE — Therapy (Signed)
OUTPATIENT PHYSICAL THERAPY FEMALE PELVIC TREATMENT   Patient Name: Amber Kelley MRN: [id number] DOB:1986-05-26, 37 y.o., female Today's Date: 10/26/2022  END OF SESSION:  PT End of Session - 10/26/22 0936     Visit Number 9    Date for PT Re-Evaluation 11/04/22    Authorization Type [employer]    PT Start Time [phone]    PT Stop Time 0930    PT Time Calculation (min) 39 min    Activity Tolerance Patient tolerated treatment well    Behavior During Therapy [facility], [place] for tasks assessed/performed                     Past Medical History:  Diagnosis Date   Anxiety    Fibroid    Hypothyroidism    Keratoconus of both eyes    Preeclampsia    Pregnancy induced hypertension    Past Surgical History:  Procedure Laterality Date   CESAREAN SECTION N/A 05/21/2022   Procedure: CESAREAN SECTION;  Surgeon: [name], DO;  Location: [facility];  Service: Obstetrics;  Laterality: N/A;   EYE SURGERY Bilateral 2019   per patient "CORNEAL CROSS-LINKING"   MYOMECTOMY N/A 05/07/2020   Procedure: ABDOMINAL MYOMECTOMY;  Surgeon: [name], DO;  Location: [facility] OR;  Service: Gynecology;  Laterality: N/A;   THYROIDECTOMY, PARTIAL  2012   Patient Active Problem List   Diagnosis Date Noted   History of myomectomy 05/20/2022   Fibroid uterus 05/02/2020   Hypothyroidism due to Hashimoto's thyroiditis 10/13/2017   Irritable bowel syndrome with diarrhea 10/13/2017   Allergic conjunctivitis of both eyes 07/26/2017   Myopia of both eyes with astigmatism 07/26/2017   Unstable keratoconus of both eyes 07/26/2017    PCP: [facility]  REFERRING PROVIDER: [name], DO   REFERRING DIAG: N81.89 (ICD-10-CM) - Other female genital prolapse   THERAPY DIAG:  Muscle weakness (generalized)  Abnormal posture  Other muscle spasm  Other low back pain  Rationale for Evaluation and Treatment: Rehabilitation  ONSET DATE: after c-section 05/21/2022 things got worse, but had some things  before pregnancy  SUBJECTIVE:                                                                                                                                                                                           SUBJECTIVE STATEMENT: Back in the center is just tight and stiff.  My period is still ongoing and will get a procedure to scrape out the lining.   Not breast feeding Fluid intake: Yes: 30 oz/day    PAIN:  Are you having pain? Yes NPRS scale: 5/10 Pain location:  low back and hip joints  Pain type: aching, discomfort Pain description: intermittent   Aggravating factors: being on period Relieving factors: heat  PRECAUTIONS: None  WEIGHT BEARING RESTRICTIONS: No  FALLS:  Has patient fallen in last 6 months? No  LIVING ENVIRONMENT: Lives with: lives with their spouse and 4 children Lives in: House/apartment   OCCUPATION: in school   PLOF: Independent  PATIENT GOALS: get rid of pain and be able to work out with leaking  PERTINENT HISTORY:  C-section recent Sexual abuse: No  BOWEL MOVEMENT: Pain with bowel movement: No Type of bowel movement:Type (Bristol Stool Scale) normal, Frequency normal, and Strain Yes every so often with digestive issues - maybe 1/month Fully empty rectum: Yes:   Leakage: No Pads: No Fiber supplement: No  URINATION: Pain with urination: No Fully empty bladder: Yes:   Stream: Strong Urgency: No Frequency: normal Leakage: Coughing, Exercise, and when bladder is full Pads: Yes: 2 pads/day  INTERCOURSE: Pain with intercourse: Initial Penetration and During Penetration Ability to have vaginal penetration:  Yes: but haven't really had intercourse since postpartum  Marinoff Scale: 3/3  PREGNANCY:  C-section deliveries 1 (3 other children not biological) Currently pregnant No  PROLAPSE: None   OBJECTIVE:   DIAGNOSTIC FINDINGS:    PATIENT SURVEYS:    PFIQ-7   COGNITION: Overall cognitive status: Within  functional limits for tasks assessed     SENSATION: Light touch:  Proprioception:   MUSCLE LENGTH: Hamstrings: Right 80 deg; Left 70 deg Thomas test:  LUMBAR SPECIAL TESTS:  ASLR  FUNCTIONAL TESTS:  Single leg normal  GAIT:  Comments: pt was carrying baby in car seat  POSTURE: rounded shoulders, increased lumbar lordosis, increased thoracic kyphosis, and anterior pelvic tilt  PELVIC ALIGNMENT: left anterior rotation  LUMBARAROM/PROM:  A/PROM A/PROM  eval  Flexion WFL some pain when leaning fwd  Extension   Right lateral flexion WFL   Left lateral flexion WFL   Right rotation   Left rotation    (Blank rows = not tested)  LOWER EXTREMITY ROM:  P/ROM Right eval Left eval  Hip flexion 85% pain lateral hip 75% pain groin  Hip extension    Hip abduction    Hip adduction    Hip internal rotation WFL pain lateral hip 75% pain groin  Hip external rotation 50%   Knee flexion    Knee extension    Ankle dorsiflexion    Ankle plantarflexion    Ankle inversion    Ankle eversion     (Blank rows = not tested)  LOWER EXTREMITY MMT:  MMT Right eval Left eval R/L 09/20/22   Hip flexion     Hip extension   4+/4+  Hip abduction 4-/5 4-/5 4+/4-  Hip adduction 4-/5 4-/5   Hip internal rotation     Hip external rotation     Knee flexion     Knee extension     Ankle dorsiflexion     Ankle plantarflexion     Ankle inversion     Ankle eversion      PALPATION:   General  tight Rt TFL and glutes, tight lumbar paraspinals                External Perineal Exam external muscle attachments tender to palpation                             Internal Pelvic Floor Rt side more inflamed, bil levators  and obturators tight and tender to palpation but Rt side more   Patient confirms identification and approves PT to assess internal pelvic floor and treatment Yes  PELVIC MMT:   MMT eval  Vaginal 3/5, 5 reps, 1 x10sec hold  Internal Anal Sphincter   External Anal Sphincter    Puborectalis   Diastasis Recti 1-2 fingers at and above umbilicus  (Blank rows = not tested)        TONE: high  PROLAPSE: no  TODAY'S TREATMENT:                                                                                                                              DATE:  10/26/22  NMRE: Pball - UE/LE and both together -keeping core engaged Half kneel rotation tband  Manual: Lumbar and thoracic paraspinal STM, gluteals and hip flexor Trigger Point Dry-Needling  Treatment instructions: Expect mild to moderate muscle soreness. S/S of pneumothorax if dry needled over a lung field, and to seek immediate medical attention should they occur. Patient verbalized understanding of these instructions and education.  Patient Consent Given: Yes Education handout provided: Yes Muscles treated: lumbar, thoracic multifidi,  Electrical stimulation performed: No Parameters: N/A Treatment response/outcome: increased soft tissue length palpated  Patient confirms identification and approves physical therapist to perform internal soft tissue work- internal to introitus and levators Rt>Lt, obturator  External pelvic floor to I.cavernosis and TP      PATIENT EDUCATION:  Education details: Access Code: [email or web address] Person educated: Patient Education method: Programmer, multimedia, Facilities manager, Verbal cues, and Handouts Education comprehension: verbalized understanding  HOME EXERCISE PROGRAM: Access Code: [email or web address] URL: [email or web address] Date: 08/25/2022 Prepared by: Dwana Curd  Exercises - Supine Transversus Abdominis Bracing - Hands on Thighs  - 1 x daily - 7 x weekly - 1 sets - 10 reps - 5 sec hold - Sidelying Thoracic Rotation with Open Book  - 1 x daily - 7 x weekly - 1 sets - 5 reps - 10 sec hold - Supine Diaphragmatic Breathing  - 3 x daily - 7 x weekly - 1 sets - 10 reps - Supine Hip Internal and External Rotation  - 1 x daily - 7 x weekly - 1 sets - 10 reps - 5  sec hold  ASSESSMENT:  CLINICAL IMPRESSION: Today's session focused on soft tissue release lumbar and thoracic.  Pt was able to tolerate internal soft tissue work today.  Pt very tight throughout the right side more than left but both tight.  Release was achieved with soft tissue today.  Pt will benefit from skilled PT to continue working on core strength  OBJECTIVE IMPAIRMENTS: decreased coordination, decreased endurance, decreased ROM, decreased strength, increased fascial restrictions, increased muscle spasms, impaired flexibility, impaired tone, postural dysfunction, and pain.   ACTIVITY LIMITATIONS: lifting, bending, squatting, toileting, caring for others, and sitting on the floor, exercises  PARTICIPATION LIMITATIONS: interpersonal relationship and community activity  PERSONAL FACTORS: 1 comorbidity: c-section and chronic  pain that is exacerbated since pregnancy  are also affecting patient's functional outcome.   REHAB POTENTIAL: Excellent  CLINICAL DECISION MAKING: Evolving/moderate complexity  EVALUATION COMPLEXITY: Moderate   GOALS: Goals reviewed with patient? Yes  SHORT TERM GOALS: Target date: 09/09/22 Updated 08/25/22  Ind with scar massage Baseline: Goal status: MET  2.  Ind with transversus abdominus activation on exhale Baseline:  Goal status: MET  3.  Pt will report 25% less pain Baseline: 3/10 down from 5/10 initially Goal status: MET    LONG TERM GOALS: Target date: 11/04/22  Pt will report 80% reduction of pain due to improvements in posture, strength, and muscle length  Baseline: 40-50% better  Goal status: IN PROGRESS  2.  Pt will be independent with advanced HEP to maintain improvements made throughout therapy  Baseline:  Goal status: IN PROGRESS  3.  Pt will have 1-2/3 score of Marinoff scale  Baseline: 3/3 Goal status: IN PROGRESS  4.  Pt will be able to correctly activate their core and lift 20 lb x 5 reps Baseline:  Goal status: IN  PROGRESS  5.  Pt will be able to sit on the ground without increased pain Baseline:  Goal status: IN PROGRESS   PLAN:  PT FREQUENCY: 1x/week  PT DURATION: 12 weeks  PLANNED INTERVENTIONS: Therapeutic exercises, Therapeutic activity, Neuromuscular re-education, Balance training, Gait training, Patient/Family education, Self Care, Joint mobilization, Aquatic Therapy, Dry Needling, Electrical stimulation, Cryotherapy, Moist heat, Taping, Biofeedback, Manual therapy, and Re-evaluation  PLAN FOR NEXT SESSION: RE-eval, internal soft tissue to right side again as tolerated, dry needling as needed   [employer], PT 10/26/2022, 9:36 AM

## 2022-10-26 NOTE — Progress Notes (Signed)
Spoke w/ via phone for pre-op interview--- Amber Kelley Lab needs dos----   UPT. Surgeon orders pending.            Lab results------Current EKG dated 08/29/22 in Epic. COVID test -----patient states asymptomatic no test needed Arrive at -------1150 NPO after MN NO Solid Food.  Clear liquids from MN until---1050 Med rec completed Medications to take morning of surgery -----Synthroid and Zoloft Diabetic medication ----- Patient instructed no nail polish to be worn day of surgery Patient instructed to bring photo id and insurance card day of surgery Patient aware to have Driver (ride ) / caregiver  [name]  for 24 hours after surgery  Patient Special Instructions ----- Pre-Op special Instructions ----- Patient verbalized understanding of instructions that were given at this phone interview. Patient denies shortness of breath, chest pain, fever, cough at this phone interview.

## 2022-11-02 ENCOUNTER — Ambulatory Visit: Payer: Medicaid Other | Admitting: Physical Therapy

## 2022-11-02 DIAGNOSIS — M5459 Other low back pain: Secondary | ICD-10-CM

## 2022-11-02 DIAGNOSIS — M62838 Other muscle spasm: Secondary | ICD-10-CM

## 2022-11-02 DIAGNOSIS — R293 Abnormal posture: Secondary | ICD-10-CM

## 2022-11-02 DIAGNOSIS — M6281 Muscle weakness (generalized): Secondary | ICD-10-CM

## 2022-11-02 NOTE — Therapy (Signed)
OUTPATIENT PHYSICAL THERAPY FEMALE PELVIC TREATMENT   Patient Name: Amber Kelley MRN: [id number] DOB:[birth year], 37 y.o., female Today's Date: 11/02/2022  END OF SESSION:  PT End of Session - 11/02/22 0850     Visit Number 10    Date for PT Re-Evaluation 01/25/23    Authorization Type [employer]    PT Start Time 0848    PT Stop Time 0930    PT Time Calculation (min) 42 min    Activity Tolerance Patient tolerated treatment well    Behavior During Therapy WFL for tasks assessed/performed                      Past Medical History:  Diagnosis Date   Anxiety    Fibroid    Hypothyroidism    Keratoconus of both eyes    Preeclampsia    Pregnancy induced hypertension    Past Surgical History:  Procedure Laterality Date   CESAREAN SECTION N/A 05/21/2022   Procedure: CESAREAN SECTION;  Surgeon: [name], DO;  Location: [facility];  Service: Obstetrics;  Laterality: N/A;   EYE SURGERY Bilateral 2019   per patient "CORNEAL CROSS-LINKING"   MYOMECTOMY N/A 05/07/2020   Procedure: ABDOMINAL MYOMECTOMY;  Surgeon: [name], DO;  Location: [facility] OR;  Service: Gynecology;  Laterality: N/A;   THYROIDECTOMY, PARTIAL  2012   Patient Active Problem List   Diagnosis Date Noted   History of myomectomy 05/20/2022   Fibroid uterus 05/02/2020   Hypothyroidism due to Hashimoto's thyroiditis 10/13/2017   Irritable bowel syndrome with diarrhea 10/13/2017   Allergic conjunctivitis of both eyes 07/26/2017   Myopia of both eyes with astigmatism 07/26/2017   Unstable keratoconus of both eyes 07/26/2017    PCP: [facility]  REFERRING PROVIDER: [name], DO   REFERRING DIAG: N81.89 (ICD-10-CM) - Other female genital prolapse   THERAPY DIAG:  Other low back pain  Muscle weakness (generalized)  Abnormal posture  Other muscle spasm  Rationale for Evaluation and Treatment: Rehabilitation  ONSET DATE: after c-section 05/21/2022 things got worse, but had some  things before pregnancy  SUBJECTIVE:                                                                                                                                                                                           SUBJECTIVE STATEMENT: Pt states not too bad overall.   Pt wants to make sure she can strengthen to do more jumping type of activities. Pain in back when lying on back and pain with initial penetration during intercourse but not bad.  Not breast feeding Fluid intake: Yes: 30 oz/day  PAIN:  Are you having pain? Yes NPRS scale: 5/10 Pain location:  low back and hip joints  Pain type: aching, discomfort Pain description: intermittent   Aggravating factors: being on period Relieving factors: heat  PRECAUTIONS: None  WEIGHT BEARING RESTRICTIONS: No  FALLS:  Has patient fallen in last 6 months? No  LIVING ENVIRONMENT: Lives with: lives with their spouse and 4 children Lives in: House/apartment   OCCUPATION: in school   PLOF: Independent  PATIENT GOALS: get rid of pain and be able to work out with leaking  PERTINENT HISTORY:  C-section recent Sexual abuse: No  BOWEL MOVEMENT: Pain with bowel movement: No Type of bowel movement:Type (Bristol Stool Scale) normal, Frequency normal, and Strain Yes every so often with digestive issues - maybe 1/month Fully empty rectum: Yes:   Leakage: No Pads: No Fiber supplement: No  URINATION: Pain with urination: No Fully empty bladder: Yes:   Stream: Strong Urgency: No Frequency: normal Leakage: Coughing, Exercise, and when bladder is full Pads: Yes: 2 pads/day  INTERCOURSE: Pain with intercourse: Initial Penetration and During Penetration Ability to have vaginal penetration:  Yes: but haven't really had intercourse since postpartum  Marinoff Scale: 3/3  PREGNANCY:  C-section deliveries 1 (3 other children not biological) Currently pregnant No  PROLAPSE: None   OBJECTIVE:   DIAGNOSTIC FINDINGS:     PATIENT SURVEYS:    PFIQ-7   COGNITION: Overall cognitive status: Within functional limits for tasks assessed     SENSATION: Light touch:  Proprioception:   MUSCLE LENGTH: Hamstrings: Right 80 deg; Left 70 deg Thomas test:  LUMBAR SPECIAL TESTS:  ASLR  FUNCTIONAL TESTS:  Single leg normal  GAIT:  Comments: pt was carrying baby in car seat  POSTURE: rounded shoulders, increased lumbar lordosis, increased thoracic kyphosis, and anterior pelvic tilt  PELVIC ALIGNMENT: left anterior rotation  LUMBARAROM/PROM:  A/PROM A/PROM  eval  Flexion WFL some pain when leaning fwd  Extension   Right lateral flexion WFL   Left lateral flexion WFL   Right rotation   Left rotation    (Blank rows = not tested)  LOWER EXTREMITY ROM:  P/ROM Right eval Left eval  Hip flexion 85% pain lateral hip 75% pain groin  Hip extension    Hip abduction    Hip adduction    Hip internal rotation WFL pain lateral hip 75% pain groin  Hip external rotation 50%   Knee flexion    Knee extension    Ankle dorsiflexion    Ankle plantarflexion    Ankle inversion    Ankle eversion     (Blank rows = not tested)  LOWER EXTREMITY MMT:  MMT Right eval Left eval R/L 09/20/22   Hip flexion     Hip extension   4+/4+  Hip abduction 4-/5 4-/5 4+/4-  Hip adduction 4-/5 4-/5   Hip internal rotation     Hip external rotation     Knee flexion     Knee extension     Ankle dorsiflexion     Ankle plantarflexion     Ankle inversion     Ankle eversion      PALPATION:   General  tight Rt TFL and glutes, tight lumbar paraspinals                External Perineal Exam external muscle attachments tender to palpation  Internal Pelvic Floor Rt side more inflamed, bil levators and obturators tight and tender to palpation but Rt side more   Patient confirms identification and approves PT to assess internal pelvic floor and treatment Yes  PELVIC MMT:   MMT eval  11/02/22   Vaginal 3/5, 5 reps, 1 x10sec hold   Internal Anal Sphincter    External Anal Sphincter    Puborectalis    Diastasis Recti 1-2 fingers at and above umbilicus   (Blank rows = not tested)        TONE: high  PROLAPSE: no  TODAY'S TREATMENT:                                                                                                                              DATE:  11/02/22   Manual: Lumbar and thoracic paraspinal STM, gluteals and hip flexor Trigger Point Dry-Needling  Treatment instructions: Expect mild to moderate muscle soreness. S/S of pneumothorax if dry needled over a lung field, and to seek immediate medical attention should they occur. Patient verbalized understanding of these instructions and education.  Patient Consent Given: Yes Education handout provided: Yes Muscles treated: lumbar, and bil gluteal and piriformis Electrical stimulation performed: No Parameters: N/A Treatment response/outcome: increased soft tissue length palpated  Patient confirms identification and approves physical therapist to perform internal soft tissue work- internal to introitus and levators Rt>Lt, obturator  NMRE: Educated and performed kegel with exhale      PATIENT EDUCATION:  Education details: Access Code: [email or web address] Person educated: Patient Education method: Programmer, multimedia, Facilities manager, Verbal cues, and Handouts Education comprehension: verbalized understanding  HOME EXERCISE PROGRAM: Access Code: [email or web address] URL: [email or web address] Date: 08/25/2022 Prepared by: Dwana Curd  Exercises - Supine Transversus Abdominis Bracing - Hands on Thighs  - 1 x daily - 7 x weekly - 1 sets - 10 reps - 5 sec hold - Sidelying Thoracic Rotation with Open Book  - 1 x daily - 7 x weekly - 1 sets - 5 reps - 10 sec hold - Supine Diaphragmatic Breathing  - 3 x daily - 7 x weekly - 1 sets - 10 reps - Supine Hip Internal and External Rotation  - 1 x daily - 7 x  weekly - 1 sets - 10 reps - 5 sec hold  ASSESSMENT:  CLINICAL IMPRESSION: Today's session focused on pelvic floor coordination with breathing and improved soft tissue length.  Pt is still making progress and having less pain.  Pt is now able to begin basic pelvic floor strength.  Pt is recommended to continue skilled PT to work on strengthening while ensuring pt is not having increased pelvic floor and low back tension.  Pt will benefit from skilled PT to continue working on core strength  OBJECTIVE IMPAIRMENTS: decreased coordination, decreased endurance, decreased ROM, decreased strength, increased fascial restrictions, increased muscle spasms, impaired flexibility, impaired tone, postural dysfunction, and pain.   ACTIVITY LIMITATIONS: lifting, bending, squatting, toileting, caring for others, and  sitting on the floor, exercises  PARTICIPATION LIMITATIONS: interpersonal relationship and community activity  PERSONAL FACTORS: 1 comorbidity: c-section and chronic pain that is exacerbated since pregnancy  are also affecting patient's functional outcome.   REHAB POTENTIAL: Excellent  CLINICAL DECISION MAKING: Evolving/moderate complexity  EVALUATION COMPLEXITY: Moderate   GOALS: Goals reviewed with patient? Yes  SHORT TERM GOALS: Target date: 09/09/22 Updated 08/25/22  Ind with scar massage Baseline: Goal status: MET  2.  Ind with transversus abdominus activation on exhale Baseline:  Goal status: MET  3.  Pt will report 25% less pain Baseline: 3/10 down from 5/10 initially Goal status: MET    LONG TERM GOALS: Target date: 01/25/23 Updated 11/02/22  Pt will report 80% reduction of pain due to improvements in posture, strength, and muscle length  Baseline: 40-50% better  Goal status: IN PROGRESS  2.  Pt will be independent with advanced HEP to maintain improvements made throughout therapy  Baseline:  Goal status: IN PROGRESS  3.  Pt will have 0-1/3 score of Marinoff scale   Baseline: 1/3 Goal status: REVISED  4.  Pt will be able to correctly activate their core and lift 20 lb x 5 reps Baseline:  Goal status: IN PROGRESS  5.  Pt will be able to sit on the ground without increased pain Baseline:  Goal status: IN PROGRESS 6.  Pt will be able to do running/jumping related activities for at least 10 minutes without feeling leakage or urgency Baseline:  Goal status: IN PROGRESS    PLAN:  PT FREQUENCY: 1x/week  PT DURATION: 12 weeks  PLANNED INTERVENTIONS: Therapeutic exercises, Therapeutic activity, Neuromuscular re-education, Balance training, Gait training, Patient/Family education, Self Care, Joint mobilization, Aquatic Therapy, Dry Needling, Electrical stimulation, Cryotherapy, Moist heat, Taping, Biofeedback, Manual therapy, and Re-evaluation  PLAN FOR NEXT SESSION: internal soft tissue to right side again as tolerated, dry needling as needed, progress functional strength with exhale and pelvic floor engaged   [employer], PT 11/02/2022, 10:28 AM

## 2022-11-09 ENCOUNTER — Ambulatory Visit: Payer: Medicaid Other | Admitting: Physical Therapy

## 2022-11-09 DIAGNOSIS — M6281 Muscle weakness (generalized): Secondary | ICD-10-CM | POA: Diagnosis not present

## 2022-11-09 DIAGNOSIS — M62838 Other muscle spasm: Secondary | ICD-10-CM

## 2022-11-09 DIAGNOSIS — M5459 Other low back pain: Secondary | ICD-10-CM

## 2022-11-09 DIAGNOSIS — R293 Abnormal posture: Secondary | ICD-10-CM

## 2022-11-09 NOTE — Therapy (Signed)
OUTPATIENT PHYSICAL THERAPY FEMALE PELVIC TREATMENT   Patient Name: Amber Kelley MRN: [id number] DOB:07-25-1985, 37 y.o., female Today's Date: 11/09/2022  END OF SESSION:  PT End of Session - 11/09/22 0853     Visit Number 11    Date for PT Re-Evaluation 01/25/23    Authorization Type [employer]    PT Start Time 0851    PT Stop Time 0929    PT Time Calculation (min) 38 min    Activity Tolerance Patient tolerated treatment well    Behavior During Therapy [facility] for tasks assessed/performed                       Past Medical History:  Diagnosis Date   Anxiety    Fibroid    Hypothyroidism    Keratoconus of both eyes    Preeclampsia    Pregnancy induced hypertension    Past Surgical History:  Procedure Laterality Date   CESAREAN SECTION N/A 05/21/2022   Procedure: CESAREAN SECTION;  Surgeon: [name], DO;  Location: [facility];  Service: Obstetrics;  Laterality: N/A;   EYE SURGERY Bilateral 2019   per patient "CORNEAL CROSS-LINKING"   MYOMECTOMY N/A 05/07/2020   Procedure: ABDOMINAL MYOMECTOMY;  Surgeon: [name], DO;  Location: [facility] OR;  Service: Gynecology;  Laterality: N/A;   THYROIDECTOMY, PARTIAL  2012   Patient Active Problem List   Diagnosis Date Noted   History of myomectomy 05/20/2022   Fibroid uterus 05/02/2020   Hypothyroidism due to Hashimoto's thyroiditis 10/13/2017   Irritable bowel syndrome with diarrhea 10/13/2017   Allergic conjunctivitis of both eyes 07/26/2017   Myopia of both eyes with astigmatism 07/26/2017   Unstable keratoconus of both eyes 07/26/2017    PCP: [facility]  REFERRING PROVIDER: [name], DO   REFERRING DIAG: N81.89 (ICD-10-CM) - Other female genital prolapse   THERAPY DIAG:  Other low back pain  Muscle weakness (generalized)  Abnormal posture  Other muscle spasm  Rationale for Evaluation and Treatment: Rehabilitation  ONSET DATE: after c-section 05/21/2022 things got worse, but had some  things before pregnancy  SUBJECTIVE:                                                                                                                                                                                           SUBJECTIVE STATEMENT: Pt states has barely felt any pain just some low back tension but not bad.  Not breast feeding Fluid intake: Yes: 30 oz/day    PAIN:  Are you having pain? No   PRECAUTIONS: None  WEIGHT BEARING RESTRICTIONS: No  FALLS:  Has patient fallen  in last 6 months? No  LIVING ENVIRONMENT: Lives with: lives with their spouse and 4 children Lives in: House/apartment   OCCUPATION: in school   PLOF: Independent  PATIENT GOALS: get rid of pain and be able to work out with leaking  PERTINENT HISTORY:  C-section recent Sexual abuse: No  BOWEL MOVEMENT: Pain with bowel movement: No Type of bowel movement:Type (Bristol Stool Scale) normal, Frequency normal, and Strain Yes every so often with digestive issues - maybe 1/month Fully empty rectum: Yes:   Leakage: No Pads: No Fiber supplement: No  URINATION: Pain with urination: No Fully empty bladder: Yes:   Stream: Strong Urgency: No Frequency: normal Leakage: Coughing, Exercise, and when bladder is full Pads: Yes: 2 pads/day  INTERCOURSE: Pain with intercourse: Initial Penetration and During Penetration Ability to have vaginal penetration:  Yes: but haven't really had intercourse since postpartum  Marinoff Scale: 3/3  PREGNANCY:  C-section deliveries 1 (3 other children not biological) Currently pregnant No  PROLAPSE: None   OBJECTIVE:   DIAGNOSTIC FINDINGS:    PATIENT SURVEYS:    PFIQ-7   COGNITION: Overall cognitive status: Within functional limits for tasks assessed     SENSATION: Light touch:  Proprioception:   MUSCLE LENGTH: Hamstrings: Right 80 deg; Left 70 deg Thomas test:  LUMBAR SPECIAL TESTS:  ASLR  FUNCTIONAL TESTS:  Single leg  normal  GAIT:  Comments: pt was carrying baby in car seat  POSTURE: rounded shoulders, increased lumbar lordosis, increased thoracic kyphosis, and anterior pelvic tilt  PELVIC ALIGNMENT: left anterior rotation  LUMBARAROM/PROM:  A/PROM A/PROM  eval  Flexion WFL some pain when leaning fwd  Extension   Right lateral flexion WFL   Left lateral flexion WFL   Right rotation   Left rotation    (Blank rows = not tested)  LOWER EXTREMITY ROM:  P/ROM Right eval Left eval  Hip flexion 85% pain lateral hip 75% pain groin  Hip extension    Hip abduction    Hip adduction    Hip internal rotation WFL pain lateral hip 75% pain groin  Hip external rotation 50%   Knee flexion    Knee extension    Ankle dorsiflexion    Ankle plantarflexion    Ankle inversion    Ankle eversion     (Blank rows = not tested)  LOWER EXTREMITY MMT:  MMT Right eval Left eval R/L 09/20/22   Hip flexion     Hip extension   4+/4+  Hip abduction 4-/5 4-/5 4+/4-  Hip adduction 4-/5 4-/5   Hip internal rotation     Hip external rotation     Knee flexion     Knee extension     Ankle dorsiflexion     Ankle plantarflexion     Ankle inversion     Ankle eversion      PALPATION:   General  tight Rt TFL and glutes, tight lumbar paraspinals                External Perineal Exam external muscle attachments tender to palpation                             Internal Pelvic Floor Rt side more inflamed, bil levators and obturators tight and tender to palpation but Rt side more   Patient confirms identification and approves PT to assess internal pelvic floor and treatment Yes  PELVIC MMT:   MMT eval 11/02/22  Vaginal 3/5, 5 reps, 1 x10sec hold   Internal Anal Sphincter    External Anal Sphincter    Puborectalis    Diastasis Recti 1-2 fingers at and above umbilicus   (Blank rows = not tested)        TONE: high  PROLAPSE: no  TODAY'S TREATMENT:                                                                                                                               DATE:  11/09/22   Manual:  Patient confirms identification and approves physical therapist to perform internal soft tissue work- internal to introitus and levators Rt>Lt, obturator  NMRE: Kegel with exercises Lunge with slider - back and side Single leg modified dead lift Squat with press out 7lb pulley      PATIENT EDUCATION:  Education details: Access Code: [email or web address] Person educated: Patient Education method: Programmer, multimedia, Demonstration, Verbal cues, and Handouts Education comprehension: verbalized understanding  HOME EXERCISE PROGRAM: Access Code: [email or web address] URL: [email or web address] Date: 08/25/2022 Prepared by: Dwana Curd  Exercises - Supine Transversus Abdominis Bracing - Hands on Thighs  - 1 x daily - 7 x weekly - 1 sets - 10 reps - 5 sec hold - Sidelying Thoracic Rotation with Open Book  - 1 x daily - 7 x weekly - 1 sets - 5 reps - 10 sec hold - Supine Diaphragmatic Breathing  - 3 x daily - 7 x weekly - 1 sets - 10 reps - Supine Hip Internal and External Rotation  - 1 x daily - 7 x weekly - 1 sets - 10 reps - 5 sec hold  ASSESSMENT:  CLINICAL IMPRESSION: Today's session focused on pelvic floor coordination with breathing and improved soft tissue length.  Pt is still making progress and having less pain.  Pt is now able to begin more advanced functional pelvic floor strength.  Pt is recommended to continue skilled PT to work on strengthening while ensuring pt is not having increased pelvic floor and low back tension.  Pt will benefit from skilled PT to continue working on core strength  OBJECTIVE IMPAIRMENTS: decreased coordination, decreased endurance, decreased ROM, decreased strength, increased fascial restrictions, increased muscle spasms, impaired flexibility, impaired tone, postural dysfunction, and pain.   ACTIVITY LIMITATIONS: lifting, bending, squatting, toileting,  caring for others, and sitting on the floor, exercises  PARTICIPATION LIMITATIONS: interpersonal relationship and community activity  PERSONAL FACTORS: 1 comorbidity: c-section and chronic pain that is exacerbated since pregnancy  are also affecting patient's functional outcome.   REHAB POTENTIAL: Excellent  CLINICAL DECISION MAKING: Evolving/moderate complexity  EVALUATION COMPLEXITY: Moderate   GOALS: Goals reviewed with patient? Yes  SHORT TERM GOALS: Target date: 09/09/22 Updated 08/25/22  Ind with scar massage Baseline: Goal status: MET  2.  Ind with transversus abdominus activation on exhale Baseline:  Goal status: MET  3.  Pt will report 25% less pain Baseline: 3/10 down from 5/10 initially Goal status:  MET    LONG TERM GOALS: Target date: 01/25/23 Updated 11/02/22  Pt will report 80% reduction of pain due to improvements in posture, strength, and muscle length  Baseline: 40-50% better  Goal status: IN PROGRESS  2.  Pt will be independent with advanced HEP to maintain improvements made throughout therapy  Baseline:  Goal status: IN PROGRESS  3.  Pt will have 0-1/3 score of Marinoff scale  Baseline: 1/3 Goal status: REVISED  4.  Pt will be able to correctly activate their core and lift 20 lb x 5 reps Baseline:  Goal status: IN PROGRESS  5.  Pt will be able to sit on the ground without increased pain Baseline:  Goal status: IN PROGRESS 6.  Pt will be able to do running/jumping related activities for at least 10 minutes without feeling leakage or urgency Baseline:  Goal status: IN PROGRESS    PLAN:  PT FREQUENCY: 1x/week  PT DURATION: 12 weeks  PLANNED INTERVENTIONS: Therapeutic exercises, Therapeutic activity, Neuromuscular re-education, Balance training, Gait training, Patient/Family education, Self Care, Joint mobilization, Aquatic Therapy, Dry Needling, Electrical stimulation, Cryotherapy, Moist heat, Taping, Biofeedback, Manual therapy, and  Re-evaluation  PLAN FOR NEXT SESSION: internal soft tissue to right side again as tolerated, dry needling as needed, progress functional and single leg strength   Brayton Caves Adhya Cocco, PT 11/09/2022, 9:31 AM

## 2022-11-10 DIAGNOSIS — Z01818 Encounter for other preprocedural examination: Secondary | ICD-10-CM

## 2022-11-10 DIAGNOSIS — N939 Abnormal uterine and vaginal bleeding, unspecified: Secondary | ICD-10-CM

## 2022-11-10 DIAGNOSIS — R9389 Abnormal findings on diagnostic imaging of other specified body structures: Secondary | ICD-10-CM

## 2022-11-17 ENCOUNTER — Ambulatory Visit: Payer: Medicaid Other | Admitting: Physical Therapy

## 2022-11-17 DIAGNOSIS — M6281 Muscle weakness (generalized): Secondary | ICD-10-CM

## 2022-11-17 DIAGNOSIS — M5459 Other low back pain: Secondary | ICD-10-CM

## 2022-11-17 DIAGNOSIS — M62838 Other muscle spasm: Secondary | ICD-10-CM

## 2022-11-17 DIAGNOSIS — R293 Abnormal posture: Secondary | ICD-10-CM

## 2022-11-17 NOTE — Therapy (Signed)
OUTPATIENT PHYSICAL THERAPY FEMALE PELVIC TREATMENT   Patient Name: Amber Kelley MRN: [id number] DOB:1985/10/17, 37 y.o., female Today's Date: 11/17/2022  END OF SESSION:  PT End of Session - 11/17/22 1621     Visit Number 12    Date for PT Re-Evaluation 01/25/23    Authorization Type [employer]    PT Start Time 1621    PT Stop Time 1700    PT Time Calculation (min) 39 min    Activity Tolerance Patient tolerated treatment well    Behavior During Therapy WFL for tasks assessed/performed                        Past Medical History:  Diagnosis Date   Anxiety    Fibroid    Hypothyroidism    Keratoconus of both eyes    Preeclampsia    Pregnancy induced hypertension    Past Surgical History:  Procedure Laterality Date   CESAREAN SECTION N/A 05/21/2022   Procedure: CESAREAN SECTION;  Surgeon: [name], DO;  Location: [facility];  Service: Obstetrics;  Laterality: N/A;   EYE SURGERY Bilateral 2019   per patient "CORNEAL CROSS-LINKING"   MYOMECTOMY N/A 05/07/2020   Procedure: ABDOMINAL MYOMECTOMY;  Surgeon: [name], DO;  Location: [facility] OR;  Service: Gynecology;  Laterality: N/A;   THYROIDECTOMY, PARTIAL  2012   Patient Active Problem List   Diagnosis Date Noted   History of myomectomy 05/20/2022   Fibroid uterus 05/02/2020   Hypothyroidism due to Hashimoto's thyroiditis 10/13/2017   Irritable bowel syndrome with diarrhea 10/13/2017   Allergic conjunctivitis of both eyes 07/26/2017   Myopia of both eyes with astigmatism 07/26/2017   Unstable keratoconus of both eyes 07/26/2017    PCP: [facility]  REFERRING PROVIDER: [name], DO   REFERRING DIAG: N81.89 (ICD-10-CM) - Other female genital prolapse   THERAPY DIAG:  Other low back pain  Muscle weakness (generalized)  Abnormal posture  Other muscle spasm  Rationale for Evaluation and Treatment: Rehabilitation  ONSET DATE: after c-section 05/21/2022 things got worse, but had some  things before pregnancy  SUBJECTIVE:                                                                                                                                                                                           SUBJECTIVE STATEMENT: Pt states had initial penetration was painful and had to stop because it did not improve.  Overall no pain and feeling better with everything else.  Have not attempted running/jumping.  Not breast feeding Fluid intake: Yes: 30 oz/day    PAIN:  Are you having  pain? No   PRECAUTIONS: None  WEIGHT BEARING RESTRICTIONS: No  FALLS:  Has patient fallen in last 6 months? No  LIVING ENVIRONMENT: Lives with: lives with their spouse and 4 children Lives in: House/apartment   OCCUPATION: in school   PLOF: Independent  PATIENT GOALS: get rid of pain and be able to work out with leaking  PERTINENT HISTORY:  C-section recent Sexual abuse: No  BOWEL MOVEMENT: Pain with bowel movement: No Type of bowel movement:Type (Bristol Stool Scale) normal, Frequency normal, and Strain Yes every so often with digestive issues - maybe 1/month Fully empty rectum: Yes:   Leakage: No Pads: No Fiber supplement: No  URINATION: Pain with urination: No Fully empty bladder: Yes:   Stream: Strong Urgency: No Frequency: normal Leakage: Coughing, Exercise, and when bladder is full Pads: Yes: 2 pads/day  INTERCOURSE: Pain with intercourse: Initial Penetration and During Penetration Ability to have vaginal penetration:  Yes: but haven't really had intercourse since postpartum  Marinoff Scale: 3/3  PREGNANCY:  C-section deliveries 1 (3 other children not biological) Currently pregnant No  PROLAPSE: None   OBJECTIVE:   DIAGNOSTIC FINDINGS:    PATIENT SURVEYS:    PFIQ-7   COGNITION: Overall cognitive status: Within functional limits for tasks assessed     SENSATION: Light touch:  Proprioception:   MUSCLE LENGTH: Hamstrings: Right 80  deg; Left 70 deg Thomas test:  LUMBAR SPECIAL TESTS:  ASLR  FUNCTIONAL TESTS:  Single leg normal  GAIT:  Comments: pt was carrying baby in car seat  POSTURE: rounded shoulders, increased lumbar lordosis, increased thoracic kyphosis, and anterior pelvic tilt  PELVIC ALIGNMENT: left anterior rotation  LUMBARAROM/PROM:  A/PROM A/PROM  eval  Flexion WFL some pain when leaning fwd  Extension   Right lateral flexion WFL   Left lateral flexion WFL   Right rotation   Left rotation    (Blank rows = not tested)  LOWER EXTREMITY ROM:  P/ROM Right eval Left eval  Hip flexion 85% pain lateral hip 75% pain groin  Hip extension    Hip abduction    Hip adduction    Hip internal rotation WFL pain lateral hip 75% pain groin  Hip external rotation 50%   Knee flexion    Knee extension    Ankle dorsiflexion    Ankle plantarflexion    Ankle inversion    Ankle eversion     (Blank rows = not tested)  LOWER EXTREMITY MMT:  MMT Right eval Left eval R/L 09/20/22   Hip flexion     Hip extension   4+/4+  Hip abduction 4-/5 4-/5 4+/4-  Hip adduction 4-/5 4-/5   Hip internal rotation     Hip external rotation     Knee flexion     Knee extension     Ankle dorsiflexion     Ankle plantarflexion     Ankle inversion     Ankle eversion      PALPATION:   General  tight Rt TFL and glutes, tight lumbar paraspinals                External Perineal Exam external muscle attachments tender to palpation                             Internal Pelvic Floor Rt side more inflamed, bil levators and obturators tight and tender to palpation but Rt side more   Patient confirms identification and approves PT to  assess internal pelvic floor and treatment Yes  PELVIC MMT:   MMT eval 11/02/22   Vaginal 3/5, 5 reps, 1 x10sec hold   Internal Anal Sphincter    External Anal Sphincter    Puborectalis    Diastasis Recti 1-2 fingers at and above umbilicus   (Blank rows = not tested)         TONE: high  PROLAPSE: no  TODAY'S TREATMENT:                                                                                                                              DATE:  11/17/22    NMRE: Hopping and bouncing on trampoline - 1 min each Elliptical - 2 min fwd/2 min back Side lunge with rotation pulleys 7lb - 15x each side Side lunge on BOSU- 15 x bil Modified single leg dead lift - 5 lb - 15x bil Exercises: Foam rolling gluteals and adductors Theract:  Revisiting self stretch with moisturizers  PATIENT EDUCATION:  Education details: Access Code: [email or web address] Person educated: Patient Education method: Programmer, multimedia, Demonstration, Verbal cues, and Handouts Education comprehension: verbalized understanding  HOME EXERCISE PROGRAM: Access Code: [email or web address] URL: [email or web address] Date: 08/25/2022 Prepared by: Dwana Curd  Exercises - Supine Transversus Abdominis Bracing - Hands on Thighs  - 1 x daily - 7 x weekly - 1 sets - 10 reps - 5 sec hold - Sidelying Thoracic Rotation with Open Book  - 1 x daily - 7 x weekly - 1 sets - 5 reps - 10 sec hold - Supine Diaphragmatic Breathing  - 3 x daily - 7 x weekly - 1 sets - 10 reps - Supine Hip Internal and External Rotation  - 1 x daily - 7 x weekly - 1 sets - 10 reps - 5 sec hold  ASSESSMENT:  CLINICAL IMPRESSION: Pt is feeling less pain and reports feeling better today.  The only complaint is still some pain with intercourse.  Today continued to focus on exercise progression with resting and stretching pelvic floor afterwards to make sure not holding too much tension.  Pt did well with no signs of leakage or discomfort other than sore muscles. Pt was encouraged to return to doing internal stretches using vaginal moisturizers.  Pt will benefit from skilled PT to continue working on core strength  OBJECTIVE IMPAIRMENTS: decreased coordination, decreased endurance, decreased ROM, decreased strength, increased  fascial restrictions, increased muscle spasms, impaired flexibility, impaired tone, postural dysfunction, and pain.   ACTIVITY LIMITATIONS: lifting, bending, squatting, toileting, caring for others, and sitting on the floor, exercises  PARTICIPATION LIMITATIONS: interpersonal relationship and community activity  PERSONAL FACTORS: 1 comorbidity: c-section and chronic pain that is exacerbated since pregnancy  are also affecting patient's functional outcome.   REHAB POTENTIAL: Excellent  CLINICAL DECISION MAKING: Evolving/moderate complexity  EVALUATION COMPLEXITY: Moderate   GOALS: Goals reviewed with patient? Yes  SHORT TERM GOALS: Target date: 09/09/22 Updated 08/25/22  Ind with scar massage Baseline:  Goal status: MET  2.  Ind with transversus abdominus activation on exhale Baseline:  Goal status: MET  3.  Pt will report 25% less pain Baseline: 3/10 down from 5/10 initially Goal status: MET    LONG TERM GOALS: Target date: 01/25/23 Updated 11/02/22  Pt will report 80% reduction of pain due to improvements in posture, strength, and muscle length  Baseline: 40-50% better  Goal status: IN PROGRESS  2.  Pt will be independent with advanced HEP to maintain improvements made throughout therapy  Baseline:  Goal status: IN PROGRESS  3.  Pt will have 0-1/3 score of Marinoff scale  Baseline: 1/3 Goal status: REVISED  4.  Pt will be able to correctly activate their core and lift 20 lb x 5 reps Baseline:  Goal status: IN PROGRESS  5.  Pt will be able to sit on the ground without increased pain Baseline:  Goal status: IN PROGRESS 6.  Pt will be able to do running/jumping related activities for at least 10 minutes without feeling leakage or urgency Baseline: not attempted this last week Goal status: IN PROGRESS    PLAN:  PT FREQUENCY: 1x/week  PT DURATION: 12 weeks  PLANNED INTERVENTIONS: Therapeutic exercises, Therapeutic activity, Neuromuscular re-education,  Balance training, Gait training, Patient/Family education, Self Care, Joint mobilization, Aquatic Therapy, Dry Needling, Electrical stimulation, Cryotherapy, Moist heat, Taping, Biofeedback, Manual therapy, and Re-evaluation  PLAN FOR NEXT SESSION: f/u on pt doing internal soft tissue 2/week with moisturizers, dry needling as needed lumbar, gluteals, adductors, hip flexors; progress functional and single leg, half kneel and prone with pillow to give support to wrists   Brayton Caves Calandria Mullings, PT 11/17/2022, 4:24 PM

## 2022-11-24 ENCOUNTER — Ambulatory Visit: Payer: Medicaid Other | Attending: Obstetrics and Gynecology | Admitting: Physical Therapy

## 2022-11-24 DIAGNOSIS — M62838 Other muscle spasm: Secondary | ICD-10-CM | POA: Diagnosis present

## 2022-11-24 DIAGNOSIS — R293 Abnormal posture: Secondary | ICD-10-CM | POA: Diagnosis present

## 2022-11-24 DIAGNOSIS — M5459 Other low back pain: Secondary | ICD-10-CM | POA: Diagnosis present

## 2022-11-24 DIAGNOSIS — M6281 Muscle weakness (generalized): Secondary | ICD-10-CM | POA: Insufficient documentation

## 2022-11-24 NOTE — Therapy (Addendum)
OUTPATIENT PHYSICAL THERAPY FEMALE PELVIC TREATMENT   Patient Name: Keystal Bridewell MRN: [id number] DOB:1985-07-30, 37 y.o., female Today's Date: 11/24/2022  END OF SESSION:  PT End of Session - 11/24/22 1446     Visit Number 13    Date for PT Re-Evaluation 01/25/23    Authorization Type [employer]    PT Start Time 1446    PT Stop Time 1526    PT Time Calculation (min) 40 min    Activity Tolerance Patient tolerated treatment well    Behavior During Therapy WFL for tasks assessed/performed                         Past Medical History:  Diagnosis Date   Anxiety    Fibroid    Hypothyroidism    Keratoconus of both eyes    Preeclampsia    Pregnancy induced hypertension    Past Surgical History:  Procedure Laterality Date   CESAREAN SECTION N/A 05/21/2022   Procedure: CESAREAN SECTION;  Surgeon: [name], DO;  Location: [facility];  Service: Obstetrics;  Laterality: N/A;   EYE SURGERY Bilateral 2019   per patient "CORNEAL CROSS-LINKING"   MYOMECTOMY N/A 05/07/2020   Procedure: ABDOMINAL MYOMECTOMY;  Surgeon: [name], DO;  Location: [facility] OR;  Service: Gynecology;  Laterality: N/A;   THYROIDECTOMY, PARTIAL  2012   Patient Active Problem List   Diagnosis Date Noted   History of myomectomy 05/20/2022   Fibroid uterus 05/02/2020   Hypothyroidism due to Hashimoto's thyroiditis 10/13/2017   Irritable bowel syndrome with diarrhea 10/13/2017   Allergic conjunctivitis of both eyes 07/26/2017   Myopia of both eyes with astigmatism 07/26/2017   Unstable keratoconus of both eyes 07/26/2017    PCP: [facility]  REFERRING PROVIDER: [name], DO   REFERRING DIAG: N81.89 (ICD-10-CM) - Other female genital prolapse   THERAPY DIAG:  Other low back pain  Other muscle spasm  Muscle weakness (generalized)  Abnormal posture  Rationale for Evaluation and Treatment: Rehabilitation  ONSET DATE: after c-section 05/21/2022 things got worse, but had  some things before pregnancy  SUBJECTIVE:                                                                                                                                                                                           SUBJECTIVE STATEMENT: Pt states she has been feeling okay.  Noticed some pulling in lower abdomen while walking a lot and that went away. Now a couple days later the back is achy.  Not breast feeding Fluid intake: Yes: 30 oz/day    PAIN:  PAIN:  Are you having pain? Yes NPRS scale: 3/10 Pain location: across the low back Pain orientation: Bilateral  PAIN TYPE: aching Pain description: intermittent  Aggravating factors: laying on my stomach Relieving factors: nothing in particular it is just tight    PRECAUTIONS: None  WEIGHT BEARING RESTRICTIONS: No  FALLS:  Has patient fallen in last 6 months? No  LIVING ENVIRONMENT: Lives with: lives with their spouse and 4 children Lives in: House/apartment   OCCUPATION: in school   PLOF: Independent  PATIENT GOALS: get rid of pain and be able to work out with leaking  PERTINENT HISTORY:  C-section recent Sexual abuse: No  BOWEL MOVEMENT: Pain with bowel movement: No Type of bowel movement:Type (Bristol Stool Scale) normal, Frequency normal, and Strain Yes every so often with digestive issues - maybe 1/month Fully empty rectum: Yes:   Leakage: No Pads: No Fiber supplement: No  URINATION: Pain with urination: No Fully empty bladder: Yes:   Stream: Strong Urgency: No Frequency: normal Leakage: Coughing, Exercise, and when bladder is full Pads: Yes: 2 pads/day  INTERCOURSE: Pain with intercourse: Initial Penetration and During Penetration Ability to have vaginal penetration:  Yes: but haven't really had intercourse since postpartum  Marinoff Scale: 3/3  PREGNANCY:  C-section deliveries 1 (3 other children not biological) Currently pregnant No  PROLAPSE: None   OBJECTIVE:    DIAGNOSTIC FINDINGS:    PATIENT SURVEYS:    PFIQ-7   COGNITION: Overall cognitive status: Within functional limits for tasks assessed     SENSATION: Light touch:  Proprioception:   MUSCLE LENGTH: Hamstrings: Right 80 deg; Left 70 deg Thomas test:  LUMBAR SPECIAL TESTS:  ASLR  FUNCTIONAL TESTS:  Single leg normal  GAIT:  Comments: pt was carrying baby in car seat  POSTURE: rounded shoulders, increased lumbar lordosis, increased thoracic kyphosis, and anterior pelvic tilt  PELVIC ALIGNMENT: left anterior rotation  LUMBARAROM/PROM:  A/PROM A/PROM  eval  Flexion WFL some pain when leaning fwd  Extension   Right lateral flexion WFL   Left lateral flexion WFL   Right rotation   Left rotation    (Blank rows = not tested)  LOWER EXTREMITY ROM:  P/ROM Right eval Left eval  Hip flexion 85% pain lateral hip 75% pain groin  Hip extension    Hip abduction    Hip adduction    Hip internal rotation WFL pain lateral hip 75% pain groin  Hip external rotation 50%   Knee flexion    Knee extension    Ankle dorsiflexion    Ankle plantarflexion    Ankle inversion    Ankle eversion     (Blank rows = not tested)  LOWER EXTREMITY MMT:  MMT Right eval Left eval R/L 09/20/22   Hip flexion     Hip extension   4+/4+  Hip abduction 4-/5 4-/5 4+/4-  Hip adduction 4-/5 4-/5   Hip internal rotation     Hip external rotation     Knee flexion     Knee extension     Ankle dorsiflexion     Ankle plantarflexion     Ankle inversion     Ankle eversion      PALPATION:   General  tight Rt TFL and glutes, tight lumbar paraspinals                External Perineal Exam external muscle attachments tender to palpation  Internal Pelvic Floor Rt side more inflamed, bil levators and obturators tight and tender to palpation but Rt side more   Patient confirms identification and approves PT to assess internal pelvic floor and treatment Yes  PELVIC  MMT:   MMT eval 11/02/22   Vaginal 3/5, 5 reps, 1 x10sec hold   Internal Anal Sphincter    External Anal Sphincter    Puborectalis    Diastasis Recti 1-2 fingers at and above umbilicus   (Blank rows = not tested)        TONE: high  PROLAPSE: no  TODAY'S TREATMENT:                                                                                                                              DATE:  11/24/22   Exericses: Qped circles Primal push up Threading in child pose Side bend on peanut with side crunch up Supine LE lift with legs on peanut halfway extended - 20x Supine peanut pass UE/LE Side plank on knees Lats with blue band shoulder adduction  Manual: Lumbar STM Trigger Point Dry-Needling  Treatment instructions: Expect mild to moderate muscle soreness. S/S of pneumothorax if dry needled over a lung field, and to seek immediate medical attention should they occur. Patient verbalized understanding of these instructions and education.  Patient Consent Given: Yes Education handout provided: Previously provided Muscles treated: lumbar and thoracic multifidi Rt side Electrical stimulation performed: No Parameters: N/A Treatment response/outcome: increased soft tissue length   PATIENT EDUCATION:  Education details: Access Code: [email or web address] Person educated: Patient Education method: Programmer, multimedia, Facilities manager, Verbal cues, and Handouts Education comprehension: verbalized understanding  HOME EXERCISE PROGRAM: Access Code: [email or web address] URL: [email or web address] Date: 08/25/2022 Prepared by: Dwana Curd  Exercises - Supine Transversus Abdominis Bracing - Hands on Thighs  - 1 x daily - 7 x weekly - 1 sets - 10 reps - 5 sec hold - Sidelying Thoracic Rotation with Open Book  - 1 x daily - 7 x weekly - 1 sets - 5 reps - 10 sec hold - Supine Diaphragmatic Breathing  - 3 x daily - 7 x weekly - 1 sets - 10 reps - Supine Hip Internal and External Rotation  -  1 x daily - 7 x weekly - 1 sets - 10 reps - 5 sec hold  ASSESSMENT:  CLINICAL IMPRESSION: Pt is feeling less pain and reports feeling better today.  The only complaint is still some pain with intercourse.  Today continued to focus on exercise progression with resting and stretching pelvic floor afterwards to make sure not holding too much tension.  Pt did well with no signs of leakage or discomfort other than sore muscles. Pt was encouraged to return to doing internal stretches using vaginal moisturizers.  Pt will benefit from skilled PT to continue working on core strength  OBJECTIVE IMPAIRMENTS: decreased coordination, decreased endurance, decreased ROM, decreased strength, increased fascial restrictions, increased muscle spasms, impaired flexibility, impaired tone, postural  dysfunction, and pain.   ACTIVITY LIMITATIONS: lifting, bending, squatting, toileting, caring for others, and sitting on the floor, exercises  PARTICIPATION LIMITATIONS: interpersonal relationship and community activity  PERSONAL FACTORS: 1 comorbidity: c-section and chronic pain that is exacerbated since pregnancy  are also affecting patient's functional outcome.   REHAB POTENTIAL: Excellent  CLINICAL DECISION MAKING: Evolving/moderate complexity  EVALUATION COMPLEXITY: Moderate   GOALS: Goals reviewed with patient? Yes  SHORT TERM GOALS: Target date: 09/09/22 Updated 08/25/22  Ind with scar massage Baseline: Goal status: MET  2.  Ind with transversus abdominus activation on exhale Baseline:  Goal status: MET  3.  Pt will report 25% less pain Baseline: 3/10 down from 5/10 initially Goal status: MET    LONG TERM GOALS: Target date: 01/25/23 Updated 11/02/22  Pt will report 80% reduction of pain due to improvements in posture, strength, and muscle length  Baseline: 40-50% better, had a couple weeks without it Goal status: IN PROGRESS  2.  Pt will be independent with advanced HEP to maintain  improvements made throughout therapy  Baseline:  Goal status: IN PROGRESS  3.  Pt will have 0-1/3 score of Marinoff scale  Baseline: 1/3 Goal status: REVISED  4.  Pt will be able to correctly activate their core and lift 20 lb x 5 reps Baseline:  Goal status: IN PROGRESS  5.  Pt will be able to sit on the ground without increased pain Baseline:  Goal status: IN PROGRESS 6.  Pt will be able to do running/jumping related activities for at least 10 minutes without feeling leakage or urgency Baseline: every 3-4 days will have a very small amount, haven't done any running Goal status: IN PROGRESS    PLAN:  PT FREQUENCY: 1x/week  PT DURATION: 12 weeks  PLANNED INTERVENTIONS: Therapeutic exercises, Therapeutic activity, Neuromuscular re-education, Balance training, Gait training, Patient/Family education, Self Care, Joint mobilization, Aquatic Therapy, Dry Needling, Electrical stimulation, Cryotherapy, Moist heat, Taping, Biofeedback, Manual therapy, and Re-evaluation  PLAN FOR NEXT SESSION: f/u on pt doing internal soft tissue 2/week with moisturizers, dry needling as needed lumbar, gluteals, adductors, hip flexors; progress functional and single leg, half kneel and prone with pillow to give support to wrists   Brayton Caves Faron Whitelock, PT 11/24/2022, 9:29 PM

## 2022-12-03 ENCOUNTER — Encounter: Payer: Self-pay | Admitting: Physical Therapy

## 2022-12-03 ENCOUNTER — Ambulatory Visit: Payer: Medicaid Other | Admitting: Physical Therapy

## 2022-12-03 DIAGNOSIS — R293 Abnormal posture: Secondary | ICD-10-CM

## 2022-12-03 DIAGNOSIS — M6281 Muscle weakness (generalized): Secondary | ICD-10-CM

## 2022-12-03 DIAGNOSIS — M62838 Other muscle spasm: Secondary | ICD-10-CM

## 2022-12-03 DIAGNOSIS — M5459 Other low back pain: Secondary | ICD-10-CM

## 2022-12-03 NOTE — Therapy (Signed)
OUTPATIENT PHYSICAL THERAPY FEMALE PELVIC TREATMENT   Patient Name: Mikeila Sutter MRN: [id number] DOB:18-Jan-1986, 37 y.o., female Today's Date: 12/03/2022  END OF SESSION:  PT End of Session - 12/03/22 0837     Visit Number 14    Date for PT Re-Evaluation 01/25/23    Authorization Type [employer]    PT Start Time 0800    PT Stop Time 0838    PT Time Calculation (min) 38 min    Activity Tolerance Patient tolerated treatment well    Behavior During Therapy [facility] for tasks assessed/performed                          Past Medical History:  Diagnosis Date   Anxiety    Fibroid    Hypothyroidism    Keratoconus of both eyes    Preeclampsia    Pregnancy induced hypertension    Past Surgical History:  Procedure Laterality Date   CESAREAN SECTION N/A 05/21/2022   Procedure: CESAREAN SECTION;  Surgeon: [name], DO;  Location: [facility] LD ORS;  Service: Obstetrics;  Laterality: N/A;   EYE SURGERY Bilateral 2019   per patient "CORNEAL CROSS-LINKING"   MYOMECTOMY N/A 05/07/2020   Procedure: ABDOMINAL MYOMECTOMY;  Surgeon: [name], DO;  Location: [facility] OR;  Service: Gynecology;  Laterality: N/A;   THYROIDECTOMY, PARTIAL  2012   Patient Active Problem List   Diagnosis Date Noted   History of myomectomy 05/20/2022   Fibroid uterus 05/02/2020   Hypothyroidism due to Hashimoto's thyroiditis 10/13/2017   Irritable bowel syndrome with diarrhea 10/13/2017   Allergic conjunctivitis of both eyes 07/26/2017   Myopia of both eyes with astigmatism 07/26/2017   Unstable keratoconus of both eyes 07/26/2017    PCP: [facility]  REFERRING PROVIDER: [name], DO   REFERRING DIAG: N81.89 (ICD-10-CM) - Other female genital prolapse   THERAPY DIAG:  Other low back pain  Abnormal posture  Other muscle spasm  Muscle weakness (generalized)  Rationale for Evaluation and Treatment: Rehabilitation  ONSET DATE: after c-section 05/21/2022 things got worse, but had  some things before pregnancy  SUBJECTIVE:                                                                                                                                                                                           SUBJECTIVE STATEMENT: Pt was not feeling well this week so didn't focus on the pelvic stuff.  Still the back is achy.  Not breast feeding Fluid intake: Yes: 30 oz/day    PAIN:  PAIN:  Are you having pain? Yes NPRS scale: 2/10 Pain location:  across the low back Pain orientation: Bilateral  PAIN TYPE: aching Pain description: intermittent  Aggravating factors: laying on my stomach Relieving factors: nothing in particular it is just tight    PRECAUTIONS: None  WEIGHT BEARING RESTRICTIONS: No  FALLS:  Has patient fallen in last 6 months? No  LIVING ENVIRONMENT: Lives with: lives with their spouse and 4 children Lives in: House/apartment   OCCUPATION: in school   PLOF: Independent  PATIENT GOALS: get rid of pain and be able to work out with leaking  PERTINENT HISTORY:  C-section recent Sexual abuse: No  BOWEL MOVEMENT: Pain with bowel movement: No Type of bowel movement:Type (Bristol Stool Scale) normal, Frequency normal, and Strain Yes every so often with digestive issues - maybe 1/month Fully empty rectum: Yes:   Leakage: No Pads: No Fiber supplement: No  URINATION: Pain with urination: No Fully empty bladder: Yes:   Stream: Strong Urgency: No Frequency: normal Leakage: Coughing, Exercise, and when bladder is full Pads: Yes: 2 pads/day  INTERCOURSE: Pain with intercourse: Initial Penetration and During Penetration Ability to have vaginal penetration:  Yes: but haven't really had intercourse since postpartum  Marinoff Scale: 3/3  PREGNANCY:  C-section deliveries 1 (3 other children not biological) Currently pregnant No  PROLAPSE: None   OBJECTIVE:   DIAGNOSTIC FINDINGS:    PATIENT SURVEYS:    PFIQ-7    COGNITION: Overall cognitive status: Within functional limits for tasks assessed     SENSATION: Light touch:  Proprioception:   MUSCLE LENGTH: Hamstrings: Right 80 deg; Left 70 deg Thomas test:  LUMBAR SPECIAL TESTS:  ASLR  FUNCTIONAL TESTS:  Single leg normal  GAIT:  Comments: pt was carrying baby in car seat  POSTURE: rounded shoulders, increased lumbar lordosis, increased thoracic kyphosis, and anterior pelvic tilt  PELVIC ALIGNMENT: left anterior rotation  LUMBARAROM/PROM:  A/PROM A/PROM  eval  Flexion WFL some pain when leaning fwd  Extension   Right lateral flexion WFL   Left lateral flexion WFL   Right rotation   Left rotation    (Blank rows = not tested)  LOWER EXTREMITY ROM:  P/ROM Right eval Left eval  Hip flexion 85% pain lateral hip 75% pain groin  Hip extension    Hip abduction    Hip adduction    Hip internal rotation WFL pain lateral hip 75% pain groin  Hip external rotation 50%   Knee flexion    Knee extension    Ankle dorsiflexion    Ankle plantarflexion    Ankle inversion    Ankle eversion     (Blank rows = not tested)  LOWER EXTREMITY MMT:  MMT Right eval Left eval R/L 09/20/22   Hip flexion     Hip extension   4+/4+  Hip abduction 4-/5 4-/5 4+/4-  Hip adduction 4-/5 4-/5   Hip internal rotation     Hip external rotation     Knee flexion     Knee extension     Ankle dorsiflexion     Ankle plantarflexion     Ankle inversion     Ankle eversion      PALPATION:   General  tight Rt TFL and glutes, tight lumbar paraspinals                External Perineal Exam external muscle attachments tender to palpation  Internal Pelvic Floor Rt side more inflamed, bil levators and obturators tight and tender to palpation but Rt side more   Patient confirms identification and approves PT to assess internal pelvic floor and treatment Yes  PELVIC MMT:   MMT eval 11/02/22   Vaginal 3/5, 5 reps, 1 x10sec  hold   Internal Anal Sphincter    External Anal Sphincter    Puborectalis    Diastasis Recti 1-2 fingers at and above umbilicus   (Blank rows = not tested)        TONE: high  PROLAPSE: no  TODAY'S TREATMENT:                                                                                                                              DATE:  12/03/22    Manual: Lumbar STM, bil gluteals and piriformis Trigger Point Dry-Needling  Treatment instructions: Expect mild to moderate muscle soreness. S/S of pneumothorax if dry needled over a lung field, and to seek immediate medical attention should they occur. Patient verbalized understanding of these instructions and education.  Patient Consent Given: Yes Education handout provided: Previously provided Muscles treated: lumbar and thoracic multifidi , and gluteals bil Electrical stimulation performed: No Parameters: N/A Treatment response/outcome: increased soft tissue length Patient confirms identification and approves physical therapist to perform internal soft tissue work  - Rt levators and obturator, ileococcygeus - kegel and cues to relax - having difficulty relaxing and gave info about perifit to work on speed  PATIENT EDUCATION:  Education details: Access Code: [email or web address] Person educated: Patient Education method: Programmer, multimedia, Facilities manager, Verbal cues, and Handouts Education comprehension: verbalized understanding  HOME EXERCISE PROGRAM: Access Code: [email or web address] URL: [email or web address] Date: 08/25/2022 Prepared by: Dwana Curd  Exercises - Supine Transversus Abdominis Bracing - Hands on Thighs  - 1 x daily - 7 x weekly - 1 sets - 10 reps - 5 sec hold - Sidelying Thoracic Rotation with Open Book  - 1 x daily - 7 x weekly - 1 sets - 5 reps - 10 sec hold - Supine Diaphragmatic Breathing  - 3 x daily - 7 x weekly - 1 sets - 10 reps - Supine Hip Internal and External Rotation  - 1 x daily - 7 x weekly - 1  sets - 10 reps - 5 sec hold  ASSESSMENT:  CLINICAL IMPRESSION: Pt continues to have tension and was not feeling well this week so did not do soft tissue work.  Pt had leakage due to cough.  Pt was given info about perifit.  Overall, soft tissue release provided immediate results with pt feeling less tension throughout.  Pt will benefit from skilled PT to continue working on improved muscle balance.  OBJECTIVE IMPAIRMENTS: decreased coordination, decreased endurance, decreased ROM, decreased strength, increased fascial restrictions, increased muscle spasms, impaired flexibility, impaired tone, postural dysfunction, and pain.   ACTIVITY LIMITATIONS: lifting, bending, squatting, toileting, caring for others, and sitting on the floor, exercises  PARTICIPATION LIMITATIONS: interpersonal relationship and community activity  PERSONAL FACTORS: 1 comorbidity: c-section and chronic pain that is exacerbated since pregnancy  are also affecting patient's functional outcome.   REHAB POTENTIAL: Excellent  CLINICAL DECISION MAKING: Evolving/moderate complexity  EVALUATION COMPLEXITY: Moderate   GOALS: Goals reviewed with patient? Yes  SHORT TERM GOALS: Target date: 09/09/22 Updated 08/25/22  Ind with scar massage Baseline: Goal status: MET  2.  Ind with transversus abdominus activation on exhale Baseline:  Goal status: MET  3.  Pt will report 25% less pain Baseline: 3/10 down from 5/10 initially Goal status: MET    LONG TERM GOALS: Target date: 01/25/23 Updated 11/02/22  Pt will report 80% reduction of pain due to improvements in posture, strength, and muscle length  Baseline: 40-50% better, had a couple weeks without it Goal status: IN PROGRESS  2.  Pt will be independent with advanced HEP to maintain improvements made throughout therapy  Baseline:  Goal status: IN PROGRESS  3.  Pt will have 0-1/3 score of Marinoff scale  Baseline: 1/3 Goal status: REVISED  4.  Pt will be able to  correctly activate their core and lift 20 lb x 5 reps Baseline:  Goal status: IN PROGRESS  5.  Pt will be able to sit on the ground without increased pain Baseline:  Goal status: IN PROGRESS 6.  Pt will be able to do running/jumping related activities for at least 10 minutes without feeling leakage or urgency Baseline: every 3-4 days will have a very small amount, haven't done any running Goal status: IN PROGRESS    PLAN:  PT FREQUENCY: 1x/week  PT DURATION: 12 weeks  PLANNED INTERVENTIONS: Therapeutic exercises, Therapeutic activity, Neuromuscular re-education, Balance training, Gait training, Patient/Family education, Self Care, Joint mobilization, Aquatic Therapy, Dry Needling, Electrical stimulation, Cryotherapy, Moist heat, Taping, Biofeedback, Manual therapy, and Re-evaluation  PLAN FOR NEXT SESSION: perift and self massage with moisturizer?, continue STM and core strength and make sure not holding breathing with core stability   Brayton Caves Fabiola Mudgett, PT 12/03/2022, 8:44 AM

## 2022-12-09 ENCOUNTER — Ambulatory Visit: Payer: Medicaid Other | Admitting: Physical Therapy

## 2022-12-09 DIAGNOSIS — M5459 Other low back pain: Secondary | ICD-10-CM | POA: Diagnosis not present

## 2022-12-09 DIAGNOSIS — M62838 Other muscle spasm: Secondary | ICD-10-CM

## 2022-12-09 DIAGNOSIS — M6281 Muscle weakness (generalized): Secondary | ICD-10-CM

## 2022-12-09 DIAGNOSIS — R293 Abnormal posture: Secondary | ICD-10-CM

## 2022-12-09 NOTE — Therapy (Signed)
OUTPATIENT PHYSICAL THERAPY FEMALE PELVIC TREATMENT   Patient Name: Amber Kelley MRN: [id number] DOB:04-03-1986, 37 y.o., female Today's Date: 12/09/2022  END OF SESSION:                 Past Medical History:  Diagnosis Date   Anxiety    Fibroid    Hypothyroidism    Keratoconus of both eyes    Preeclampsia    Pregnancy induced hypertension    Past Surgical History:  Procedure Laterality Date   CESAREAN SECTION N/A 05/21/2022   Procedure: CESAREAN SECTION;  Surgeon: [name], DO;  Location: [facility] LD [facility]S;  Service: Obstetrics;  Laterality: N/A;   EYE SURGERY Bilateral 2019   per patient "CORNEAL CROSS-LINKING"   MYOMECTOMY N/A 05/07/2020   Procedure: ABDOMINAL MYOMECTOMY;  Surgeon: [name], DO;  Location: [facility] OR;  Service: Gynecology;  Laterality: N/A;   THYROIDECTOMY, PARTIAL  2012   Patient Active Problem List   Diagnosis Date Noted   History of myomectomy 05/20/2022   Fibroid uterus 05/02/2020   Hypothyroidism due to Hashimoto's thyroiditis 10/13/2017   Irritable bowel syndrome with diarrhea 10/13/2017   Allergic conjunctivitis of both eyes 07/26/2017   Myopia of both eyes with astigmatism 07/26/2017   Unstable keratoconus of both eyes 07/26/2017    PCP: [facility]  REFERRING PROVIDER: [name], DO   REFERRING DIAG: N81.89 (ICD-10-CM) - Other female genital prolapse   THERAPY DIAG:  No diagnosis found.  Rationale for Evaluation and Treatment: Rehabilitation  ONSET DATE: after c-section 05/21/2022 things got worse, but had some things before pregnancy  SUBJECTIVE:                                                                                                                                                                                           SUBJECTIVE STATEMENT: Pt was not feeling well this week so didn't focus on the pelvic stuff.  Still the back is achy.  Not breast feeding Fluid intake: Yes: 30 oz/day     PAIN:  PAIN:  Are you having pain? Yes NPRS scale: 2/10 Pain location: across the low back Pain orientation: Bilateral  PAIN TYPE: aching Pain description: intermittent  Aggravating factors: laying on my stomach Relieving factors: nothing in particular it is just tight    PRECAUTIONS: None  WEIGHT BEARING RESTRICTIONS: No  FALLS:  Has patient fallen in last 6 months? No  LIVING ENVIRONMENT: Lives with: lives with their spouse and 4 children Lives in: House/apartment   OCCUPATION: in school   PLOF: Independent  PATIENT GOALS: get rid of pain and be able to work out with leaking  PERTINENT  HISTORY:  C-section recent Sexual abuse: No  BOWEL MOVEMENT: Pain with bowel movement: No Type of bowel movement:Type (Bristol Stool Scale) normal, Frequency normal, and Strain Yes every so often with digestive issues - maybe 1/month Fully empty rectum: Yes:   Leakage: No Pads: No Fiber supplement: No  URINATION: Pain with urination: No Fully empty bladder: Yes:   Stream: Strong Urgency: No Frequency: normal Leakage: Coughing, Exercise, and when bladder is full Pads: Yes: 2 pads/day  INTERCOURSE: Pain with intercourse: Initial Penetration and During Penetration Ability to have vaginal penetration:  Yes: but haven't really had intercourse since postpartum  Marinoff Scale: 3/3  PREGNANCY:  C-section deliveries 1 (3 other children not biological) Currently pregnant No  PROLAPSE: None   OBJECTIVE:   DIAGNOSTIC FINDINGS:    PATIENT SURVEYS:    PFIQ-7   COGNITION: Overall cognitive status: Within functional limits for tasks assessed     SENSATION: Light touch:  Proprioception:   MUSCLE LENGTH: Hamstrings: Right 80 deg; Left 70 deg Thomas test:  LUMBAR SPECIAL TESTS:  ASLR  FUNCTIONAL TESTS:  Single leg normal  GAIT:  Comments: pt was carrying baby in car seat  POSTURE: rounded shoulders, increased lumbar lordosis, increased thoracic  kyphosis, and anterior pelvic tilt  PELVIC ALIGNMENT: left anterior rotation  LUMBARAROM/PROM:  A/PROM A/PROM  eval  Flexion WFL some pain when leaning fwd  Extension   Right lateral flexion WFL   Left lateral flexion WFL   Right rotation   Left rotation    (Blank rows = not tested)  LOWER EXTREMITY ROM:  P/ROM Right eval Left eval  Hip flexion 85% pain lateral hip 75% pain groin  Hip extension    Hip abduction    Hip adduction    Hip internal rotation WFL pain lateral hip 75% pain groin  Hip external rotation 50%   Knee flexion    Knee extension    Ankle dorsiflexion    Ankle plantarflexion    Ankle inversion    Ankle eversion     (Blank rows = not tested)  LOWER EXTREMITY MMT:  MMT Right eval Left eval R/L 09/20/22   Hip flexion     Hip extension   4+/4+  Hip abduction 4-/5 4-/5 4+/4-  Hip adduction 4-/5 4-/5   Hip internal rotation     Hip external rotation     Knee flexion     Knee extension     Ankle dorsiflexion     Ankle plantarflexion     Ankle inversion     Ankle eversion      PALPATION:   General  tight Rt TFL and glutes, tight lumbar paraspinals                External Perineal Exam external muscle attachments tender to palpation                             Internal Pelvic Floor Rt side more inflamed, bil levators and obturators tight and tender to palpation but Rt side more   Patient confirms identification and approves PT to assess internal pelvic floor and treatment Yes  PELVIC MMT:   MMT eval 11/02/22   Vaginal 3/5, 5 reps, 1 x10sec hold   Internal Anal Sphincter    External Anal Sphincter    Puborectalis    Diastasis Recti 1-2 fingers at and above umbilicus   (Blank rows = not tested)  TONE: high  PROLAPSE: no  TODAY'S TREATMENT:                                                                                                                              DATE:  12/09/22   NMRE: RUSI - abdominal and transversus  abdominus activation cues to not push into bladder Manual: Lumbar STM, bil gluteals and piriformis Trigger Point Dry-Needling  Treatment instructions: Expect mild to moderate muscle soreness. S/S of pneumothorax if dry needled over a lung field, and to seek immediate medical attention should they occur. Patient verbalized understanding of these instructions and education.  Patient Consent Given: Yes Education handout provided: Previously provided Muscles treated: lumbar and thoracic multifidi , and gluteals bil Electrical stimulation performed: No Parameters: N/A Treatment response/outcome: increased soft tissue length Patient confirms identification and approves physical therapist to perform internal soft tissue work  - Rt levators and obturator, ileococcygeus - kegel and cues to relax - having difficulty relaxing and gave info about perifit to work on speed  PATIENT EDUCATION:  Education details: Access Code: [email or web address] Person educated: Patient Education method: Programmer, multimedia, Facilities manager, Verbal cues, and Handouts Education comprehension: verbalized understanding  HOME EXERCISE PROGRAM: Access Code: [email or web address] URL: [email or web address] Date: 08/25/2022 Prepared by: Dwana Curd  Exercises - Supine Transversus Abdominis Bracing - Hands on Thighs  - 1 x daily - 7 x weekly - 1 sets - 10 reps - 5 sec hold - Sidelying Thoracic Rotation with Open Book  - 1 x daily - 7 x weekly - 1 sets - 5 reps - 10 sec hold - Supine Diaphragmatic Breathing  - 3 x daily - 7 x weekly - 1 sets - 10 reps - Supine Hip Internal and External Rotation  - 1 x daily - 7 x weekly - 1 sets - 10 reps - 5 sec hold  ASSESSMENT:  CLINICAL IMPRESSION: Pt continues to have tension and was not feeling well this week so did not do soft tissue work.  Pt still having leakage due to cough.  RUSI used for improved transversus abdominus activation and not bulging.  Pt will benefit from skilled PT to  continue working on improved muscle balance.  OBJECTIVE IMPAIRMENTS: decreased coordination, decreased endurance, decreased ROM, decreased strength, increased fascial restrictions, increased muscle spasms, impaired flexibility, impaired tone, postural dysfunction, and pain.   ACTIVITY LIMITATIONS: lifting, bending, squatting, toileting, caring for others, and sitting on the floor, exercises  PARTICIPATION LIMITATIONS: interpersonal relationship and community activity  PERSONAL FACTORS: 1 comorbidity: c-section and chronic pain that is exacerbated since pregnancy  are also affecting patient's functional outcome.   REHAB POTENTIAL: Excellent  CLINICAL DECISION MAKING: Evolving/moderate complexity  EVALUATION COMPLEXITY: Moderate   GOALS: Goals reviewed with patient? Yes  SHORT TERM GOALS: Target date: 09/09/22 Updated 08/25/22  Ind with scar massage Baseline: Goal status: MET  2.  Ind with transversus abdominus activation on exhale Baseline:  Goal status: MET  3.  Pt will  report 25% less pain Baseline: 3/10 down from 5/10 initially Goal status: MET    LONG TERM GOALS: Target date: 01/25/23 Updated 12/09/22  Pt will report 80% reduction of pain due to improvements in posture, strength, and muscle length  Baseline: 40-50% better, had a couple weeks without it Goal status: IN PROGRESS  2.  Pt will be independent with advanced HEP to maintain improvements made throughout therapy  Baseline:  Goal status: IN PROGRESS  3.  Pt will have 0-1/3 score of Marinoff scale  Baseline: 1/3 Goal status: MET  4.  Pt will be able to correctly activate their core and lift 20 lb x 5 reps Baseline: working on not bulging Goal status: IN PROGRESS  5.  Pt will be able to sit on the ground without increased pain Baseline:  Goal status: IN PROGRESS 6.  Pt will be able to do running/jumping related activities for at least 10 minutes without feeling leakage or urgency Baseline: every 3-4 days  will have a very small amount, haven't done any running Goal status: IN PROGRESS    PLAN:  PT FREQUENCY: 1x/week  PT DURATION: 12 weeks  PLANNED INTERVENTIONS: Therapeutic exercises, Therapeutic activity, Neuromuscular re-education, Balance training, Gait training, Patient/Family education, Self Care, Joint mobilization, Aquatic Therapy, Dry Needling, Electrical stimulation, Cryotherapy, Moist heat, Taping, Biofeedback, Manual therapy, and Re-evaluation  PLAN FOR NEXT SESSION: not buling , core stability, continue manual Rt pelvic floor and glutes and lumbar   Brayton Caves Laurielle Selmon, PT 12/09/2022, 7:40 AM

## 2022-12-15 NOTE — Therapy (Unsigned)
OUTPATIENT PHYSICAL THERAPY FEMALE PELVIC TREATMENT   Patient Name: Amber Kelley MRN: [id number] DOB:04-29-1986, 37 y.o., female Today's Date: 12/16/2022  END OF SESSION:  PT End of Session - 12/16/22 0848     Visit Number 16    Date for PT Re-Evaluation 01/25/23    Authorization Type [employer]    PT Start Time 0846    PT Stop Time 0925    PT Time Calculation (min) 39 min    Activity Tolerance Patient tolerated treatment well    Behavior During Therapy [facility] for tasks assessed/performed                           Past Medical History:  Diagnosis Date   Anxiety    Fibroid    Hypothyroidism    Keratoconus of both eyes    Preeclampsia    Pregnancy induced hypertension    Past Surgical History:  Procedure Laterality Date   CESAREAN SECTION N/A 05/21/2022   Procedure: CESAREAN SECTION;  Surgeon: [name], DO;  Location: [facility] LD ORS;  Service: Obstetrics;  Laterality: N/A;   EYE SURGERY Bilateral 2019   per patient "CORNEAL CROSS-LINKING"   MYOMECTOMY N/A 05/07/2020   Procedure: ABDOMINAL MYOMECTOMY;  Surgeon: [name], DO;  Location: [facility] OR;  Service: Gynecology;  Laterality: N/A;   THYROIDECTOMY, PARTIAL  2012   Patient Active Problem List   Diagnosis Date Noted   History of myomectomy 05/20/2022   Fibroid uterus 05/02/2020   Hypothyroidism due to Hashimoto's thyroiditis 10/13/2017   Irritable bowel syndrome with diarrhea 10/13/2017   Allergic conjunctivitis of both eyes 07/26/2017   Myopia of both eyes with astigmatism 07/26/2017   Unstable keratoconus of both eyes 07/26/2017    PCP: [facility]  REFERRING PROVIDER: [name], DO   REFERRING DIAG: N81.89 (ICD-10-CM) - Other female genital prolapse   THERAPY DIAG:  Muscle weakness (generalized)  Abnormal posture  Other low back pain  Other muscle spasm  Rationale for Evaluation and Treatment: Rehabilitation  ONSET DATE: after c-section 05/21/2022 things got worse, but  had some things before pregnancy  SUBJECTIVE:                                                                                                                                                                                           SUBJECTIVE STATEMENT: Pt feels like things are better overall, no pain just tight low back, was working on the engaged core  Not breast feeding Fluid intake: Yes: 30 oz/day    PAIN:  PAIN:  Are you having pain? No    PRECAUTIONS: None  WEIGHT BEARING RESTRICTIONS: No  FALLS:  Has patient fallen in last 6 months? No  LIVING ENVIRONMENT: Lives with: lives with their spouse and 4 children Lives in: House/apartment   OCCUPATION: in school   PLOF: Independent  PATIENT GOALS: get rid of pain and be able to work out with leaking  PERTINENT HISTORY:  C-section recent Sexual abuse: No  BOWEL MOVEMENT: Pain with bowel movement: No Type of bowel movement:Type (Bristol Stool Scale) normal, Frequency normal, and Strain Yes every so often with digestive issues - maybe 1/month Fully empty rectum: Yes:   Leakage: No Pads: No Fiber supplement: No  URINATION: Pain with urination: No Fully empty bladder: Yes:   Stream: Strong Urgency: No Frequency: normal Leakage: Coughing, Exercise, and when bladder is full Pads: Yes: 2 pads/day  INTERCOURSE: Pain with intercourse: Initial Penetration and During Penetration Ability to have vaginal penetration:  Yes: but haven't really had intercourse since postpartum  Marinoff Scale: 3/3  PREGNANCY:  C-section deliveries 1 (3 other children not biological) Currently pregnant No  PROLAPSE: None   OBJECTIVE:   DIAGNOSTIC FINDINGS:    PATIENT SURVEYS:    PFIQ-7   COGNITION: Overall cognitive status: Within functional limits for tasks assessed     SENSATION: Light touch:  Proprioception:   MUSCLE LENGTH: Hamstrings: Right 80 deg; Left 70 deg Thomas test:  LUMBAR SPECIAL TESTS:   ASLR  FUNCTIONAL TESTS:  Single leg normal  GAIT:  Comments: pt was carrying baby in car seat  POSTURE: rounded shoulders, increased lumbar lordosis, increased thoracic kyphosis, and anterior pelvic tilt  PELVIC ALIGNMENT: left anterior rotation  LUMBARAROM/PROM:  A/PROM A/PROM  eval  Flexion WFL some pain when leaning fwd  Extension   Right lateral flexion WFL   Left lateral flexion WFL   Right rotation   Left rotation    (Blank rows = not tested)  LOWER EXTREMITY ROM:  P/ROM Right eval Left eval  Hip flexion 85% pain lateral hip 75% pain groin  Hip extension    Hip abduction    Hip adduction    Hip internal rotation WFL pain lateral hip 75% pain groin  Hip external rotation 50%   Knee flexion    Knee extension    Ankle dorsiflexion    Ankle plantarflexion    Ankle inversion    Ankle eversion     (Blank rows = not tested)  LOWER EXTREMITY MMT:  MMT Right eval Left eval R/L 09/20/22   Hip flexion     Hip extension   4+/4+  Hip abduction 4-/5 4-/5 4+/4-  Hip adduction 4-/5 4-/5   Hip internal rotation     Hip external rotation     Knee flexion     Knee extension     Ankle dorsiflexion     Ankle plantarflexion     Ankle inversion     Ankle eversion      PALPATION:   General  tight Rt TFL and glutes, tight lumbar paraspinals                External Perineal Exam external muscle attachments tender to palpation                             Internal Pelvic Floor Rt side more inflamed, bil levators and obturators tight and tender to palpation but Rt side more   Patient confirms identification and approves PT to assess internal pelvic floor and treatment Yes  PELVIC MMT:   MMT eval 11/02/22   Vaginal 3/5, 5 reps, 1 x10sec hold   Internal Anal Sphincter    External Anal Sphincter    Puborectalis    Diastasis Recti 1-2 fingers at and above umbilicus   (Blank rows = not tested)        TONE: high  PROLAPSE: no  TODAY'S TREATMENT:                                                                                                                               DATE:  12/16/22   NMRE: exhale with UE reaches on peanut ball from forearms and then hands Manual: Fascial release of lumbar and around ribcage more on the Rt side - breathing and stretch with exhale Patient confirms identification and approves physical therapist to perform internal soft tissue work  - Rt levators and obturator, ileococcygeus -  Theract: info on pelvic wand  PATIENT EDUCATION:  Education details: Access Code: [email or web address] Person educated: Patient Education method: Programmer, multimedia, Demonstration, Verbal cues, and Handouts Education comprehension: verbalized understanding  HOME EXERCISE PROGRAM: Access Code: [email or web address] URL: [email or web address] Date: 08/25/2022 Prepared by: Dwana Curd  Exercises - Supine Transversus Abdominis Bracing - Hands on Thighs  - 1 x daily - 7 x weekly - 1 sets - 10 reps - 5 sec hold - Sidelying Thoracic Rotation with Open Book  - 1 x daily - 7 x weekly - 1 sets - 5 reps - 10 sec hold - Supine Diaphragmatic Breathing  - 3 x daily - 7 x weekly - 1 sets - 10 reps - Supine Hip Internal and External Rotation  - 1 x daily - 7 x weekly - 1 sets - 10 reps - 5 sec hold  ASSESSMENT:  CLINICAL IMPRESSION: Pt continues to have tension more on the Rt side. Pt improved with engaging core correctly but needing cues not to compensate with side bending with qped ex's.  Pt was given info about pelvic wand since she continues to have tension in the Rt obturator and ileococcygeus muscles.  Pt will benefit from one more visit to ensure successful transition to HEP  OBJECTIVE IMPAIRMENTS: decreased coordination, decreased endurance, decreased ROM, decreased strength, increased fascial restrictions, increased muscle spasms, impaired flexibility, impaired tone, postural dysfunction, and pain.   ACTIVITY LIMITATIONS: lifting, bending,  squatting, toileting, caring for others, and sitting on the floor, exercises  PARTICIPATION LIMITATIONS: interpersonal relationship and community activity  PERSONAL FACTORS: 1 comorbidity: c-section and chronic pain that is exacerbated since pregnancy  are also affecting patient's functional outcome.   REHAB POTENTIAL: Excellent  CLINICAL DECISION MAKING: Evolving/moderate complexity  EVALUATION COMPLEXITY: Moderate   GOALS: Goals reviewed with patient? Yes  SHORT TERM GOALS: Target date: 09/09/22 Updated 08/25/22  Ind with scar massage Baseline: Goal status: MET  2.  Ind with transversus abdominus activation on exhale Baseline:  Goal status: MET  3.  Pt will report 25% less pain Baseline:  3/10 down from 5/10 initially Goal status: MET    LONG TERM GOALS: Target date: 01/25/23 Updated 12/09/22  Pt will report 80% reduction of pain due to improvements in posture, strength, and muscle length  Baseline: 40-50% better, had a couple weeks without it Goal status: IN PROGRESS  2.  Pt will be independent with advanced HEP to maintain improvements made throughout therapy  Baseline:  Goal status: IN PROGRESS  3.  Pt will have 0-1/3 score of Marinoff scale  Baseline: 1/3 Goal status: MET  4.  Pt will be able to correctly activate their core and lift 20 lb x 5 reps Baseline: working on not bulging Goal status: IN PROGRESS  5.  Pt will be able to sit on the ground without increased pain Baseline:  Goal status: IN PROGRESS 6.  Pt will be able to do running/jumping related activities for at least 10 minutes without feeling leakage or urgency Baseline: every 3-4 days will have a very small amount, haven't done any running Goal status: IN PROGRESS    PLAN:  PT FREQUENCY: 1x/week  PT DURATION: 12 weeks  PLANNED INTERVENTIONS: Therapeutic exercises, Therapeutic activity, Neuromuscular re-education, Balance training, Gait training, Patient/Family education, Self Care, Joint  mobilization, Aquatic Therapy, Dry Needling, Electrical stimulation, Cryotherapy, Moist heat, Taping, Biofeedback, Manual therapy, and Re-evaluation  PLAN FOR NEXT SESSION: not bulging core, continue manual Rt pelvic floor as needed review pelvic wand   Brayton Caves Shenna Brissette, PT 12/16/2022, 9:07 AM

## 2022-12-16 ENCOUNTER — Ambulatory Visit: Payer: Medicaid Other | Admitting: Physical Therapy

## 2022-12-16 DIAGNOSIS — M5459 Other low back pain: Secondary | ICD-10-CM | POA: Diagnosis not present

## 2022-12-16 DIAGNOSIS — R293 Abnormal posture: Secondary | ICD-10-CM

## 2022-12-16 DIAGNOSIS — M62838 Other muscle spasm: Secondary | ICD-10-CM

## 2022-12-16 DIAGNOSIS — M6281 Muscle weakness (generalized): Secondary | ICD-10-CM

## 2022-12-21 ENCOUNTER — Encounter (HOSPITAL_BASED_OUTPATIENT_CLINIC_OR_DEPARTMENT_OTHER): Payer: Self-pay | Admitting: Obstetrics and Gynecology

## 2022-12-21 NOTE — Progress Notes (Signed)
Spoke w/ via phone for pre-op interview--- [name] needs dos---- UPT,CBC and T&S per surgeon.              Lab results------ COVID test -----patient states asymptomatic no test needed Arrive at -------0930 NPO after MN NO Solid Food.  Clear liquids from MN until---0830 Med rec completed Medications to take morning of surgery -----Synthroid and Zoloft Diabetic medication ----- Patient instructed no nail polish to be worn day of surgery Patient instructed to bring photo id and insurance card day of surgery Patient aware to have Driver (ride ) / caregiver Husband [name]   for 24 hours after surgery  Patient Special Instructions ----- Pre-Op special Instructions ----- Patient verbalized understanding of instructions that were given at this phone interview. Patient denies shortness of breath, chest pain, fever, cough at this phone interview.

## 2022-12-21 NOTE — Therapy (Addendum)
OUTPATIENT PHYSICAL THERAPY FEMALE PELVIC TREATMENT   Patient Name: Amber Kelley MRN: [id number] DOB:[birth year], 37 y.o., female Today's Date: 12/22/2022  END OF SESSION:  PT End of Session - 12/22/22 1022     Visit Number 17    Date for PT Re-Evaluation 01/25/23    Authorization Type U[employer]    PT Start Time 1020    PT Stop Time 1100    PT Time Calculation (min) 40 min    Activity Tolerance Patient tolerated treatment well    Behavior During Therapy WFL for tasks assessed/performed                            Past Medical History:  Diagnosis Date   Anxiety    Fibroid    Hypothyroidism    Keratoconus of both eyes    Preeclampsia    Pregnancy induced hypertension    Past Surgical History:  Procedure Laterality Date   CESAREAN SECTION N/A 05/21/2022   Procedure: CESAREAN SECTION;  Surgeon: [name], DO;  Location: [facility] LD ORS;  Service: Obstetrics;  Laterality: N/A;   EYE SURGERY Bilateral 2019   per patient "CORNEAL CROSS-LINKING"   MYOMECTOMY N/A 05/07/2020   Procedure: ABDOMINAL MYOMECTOMY;  Surgeon: [name], DO;  Location: [facility] OR;  Service: Gynecology;  Laterality: N/A;   THYROIDECTOMY, PARTIAL  2012   Patient Active Problem List   Diagnosis Date Noted   History of myomectomy 05/20/2022   Fibroid uterus 05/02/2020   Hypothyroidism due to Hashimoto's thyroiditis 10/13/2017   Irritable bowel syndrome with diarrhea 10/13/2017   Allergic conjunctivitis of both eyes 07/26/2017   Myopia of both eyes with astigmatism 07/26/2017   Unstable keratoconus of both eyes 07/26/2017    PCP: [facility]  REFERRING PROVIDER: [name], DO   REFERRING DIAG: N81.89 (ICD-10-CM) - Other female genital prolapse   THERAPY DIAG:  Muscle weakness (generalized)  Abnormal posture  Other low back pain  Other muscle spasm  Rationale for Evaluation and Treatment: Rehabilitation  ONSET DATE: after c-section 05/21/2022 things got worse, but  had some things before pregnancy  SUBJECTIVE:                                                                                                                                                                                           SUBJECTIVE STATEMENT: Pt feels like things are better overall, no pain just tight low back, was working on the engaged core  Not breast feeding Fluid intake: Yes: 30 oz/day    PAIN:  PAIN:  Are you having pain? No    PRECAUTIONS:  None  WEIGHT BEARING RESTRICTIONS: No  FALLS:  Has patient fallen in last 6 months? No  LIVING ENVIRONMENT: Lives with: lives with their spouse and 4 children Lives in: House/apartment   OCCUPATION: in school   PLOF: Independent  PATIENT GOALS: get rid of pain and be able to work out with leaking  PERTINENT HISTORY:  C-section recent Sexual abuse: No  BOWEL MOVEMENT: Pain with bowel movement: No Type of bowel movement:Type (Bristol Stool Scale) normal, Frequency normal, and Strain Yes every so often with digestive issues - maybe 1/month Fully empty rectum: Yes:   Leakage: No Pads: No Fiber supplement: No  URINATION: Pain with urination: No Fully empty bladder: Yes:   Stream: Strong Urgency: No Frequency: normal Leakage: Coughing, Exercise, and when bladder is full Pads: Yes: 2 pads/day  INTERCOURSE: Pain with intercourse: Initial Penetration and During Penetration Ability to have vaginal penetration:  Yes: but haven't really had intercourse since postpartum  Marinoff Scale: 3/3  PREGNANCY:  C-section deliveries 1 (3 other children not biological) Currently pregnant No  PROLAPSE: None   OBJECTIVE:   DIAGNOSTIC FINDINGS:    PATIENT SURVEYS:    PFIQ-7   COGNITION: Overall cognitive status: Within functional limits for tasks assessed     SENSATION: Light touch:  Proprioception:   MUSCLE LENGTH: Hamstrings: Right 80 deg; Left 70 deg Thomas test:  LUMBAR SPECIAL TESTS:   ASLR  FUNCTIONAL TESTS:  Single leg normal  GAIT:  Comments: pt was carrying baby in car seat  POSTURE: rounded shoulders, increased lumbar lordosis, increased thoracic kyphosis, and anterior pelvic tilt  PELVIC ALIGNMENT: left anterior rotation  LUMBARAROM/PROM:  A/PROM A/PROM  eval  Flexion WFL some pain when leaning fwd  Extension   Right lateral flexion WFL   Left lateral flexion WFL   Right rotation   Left rotation    (Blank rows = not tested)  LOWER EXTREMITY ROM:  P/ROM Right eval Left eval  Hip flexion 85% pain lateral hip 75% pain groin  Hip extension    Hip abduction    Hip adduction    Hip internal rotation WFL pain lateral hip 75% pain groin  Hip external rotation 50%   Knee flexion    Knee extension    Ankle dorsiflexion    Ankle plantarflexion    Ankle inversion    Ankle eversion     (Blank rows = not tested)  LOWER EXTREMITY MMT:  MMT Right eval Left eval R/L 09/20/22   Hip flexion     Hip extension   4+/4+  Hip abduction 4-/5 4-/5 4+/4-  Hip adduction 4-/5 4-/5   Hip internal rotation     Hip external rotation     Knee flexion     Knee extension     Ankle dorsiflexion     Ankle plantarflexion     Ankle inversion     Ankle eversion      PALPATION:   General  tight Rt TFL and glutes, tight lumbar paraspinals                External Perineal Exam external muscle attachments tender to palpation                             Internal Pelvic Floor Rt side more inflamed, bil levators and obturators tight and tender to palpation but Rt side more   Patient confirms identification and approves PT to assess internal pelvic floor and  treatment Yes  PELVIC MMT:   MMT eval 11/02/22   Vaginal 3/5, 5 reps, 1 x10sec hold   Internal Anal Sphincter    External Anal Sphincter    Puborectalis    Diastasis Recti 1-2 fingers at and above umbilicus   (Blank rows = not tested)        TONE: high  PROLAPSE: no  TODAY'S TREATMENT:                                                                                                                               DATE:  12/22/22   Exercises:  Band pull away from wall - step out 10x Diagonals standing 15x Modified dead lift  5 lb touch cone Dead lift 4 lb  Manual lumbar and thoracic paraspinals Trigger Point Dry-Needling  Treatment instructions: Expect mild to moderate muscle soreness. S/S of pneumothorax if dry needled over a lung field, and to seek immediate medical attention should they occur. Patient verbalized understanding of these instructions and education.  Patient Consent Given: Yes Education handout provided: Previously provided Muscles treated: lumbar and thoracic multifidi Electrical stimulation performed: No Parameters: N/A Treatment response/outcome: increased soft tissue length   PATIENT EDUCATION:  Education details: Access Code: [email or web address] Person educated: Patient Education method: Programmer, multimedia, Facilities manager, Verbal cues, and Handouts Education comprehension: verbalized understanding  HOME EXERCISE PROGRAM: Access Code: [email or web address] URL: [email or web address] Date: 08/25/2022 Prepared by: Dwana Curd  Exercises - Supine Transversus Abdominis Bracing - Hands on Thighs  - 1 x daily - 7 x weekly - 1 sets - 10 reps - 5 sec hold - Sidelying Thoracic Rotation with Open Book  - 1 x daily - 7 x weekly - 1 sets - 5 reps - 10 sec hold - Supine Diaphragmatic Breathing  - 3 x daily - 7 x weekly - 1 sets - 10 reps - Supine Hip Internal and External Rotation  - 1 x daily - 7 x weekly - 1 sets - 10 reps - 5 sec hold  ASSESSMENT:  CLINICAL IMPRESSION: Pt has met or partially met goals.  Pt is ind with HEP and feels able to continue making progress on her own at this time.  Pt will d/c with HEP today  OBJECTIVE IMPAIRMENTS: decreased coordination, decreased endurance, decreased ROM, decreased strength, increased fascial restrictions, increased muscle  spasms, impaired flexibility, impaired tone, postural dysfunction, and pain.   ACTIVITY LIMITATIONS: lifting, bending, squatting, toileting, caring for others, and sitting on the floor, exercises  PARTICIPATION LIMITATIONS: interpersonal relationship and community activity  PERSONAL FACTORS: 1 comorbidity: c-section and chronic pain that is exacerbated since pregnancy  are also affecting patient's functional outcome.   REHAB POTENTIAL: Excellent  CLINICAL DECISION MAKING: Evolving/moderate complexity  EVALUATION COMPLEXITY: Moderate   GOALS: Goals reviewed with patient? Yes  SHORT TERM GOALS: Target date: 09/09/22 Updated 08/25/22  Ind with scar massage Baseline: Goal status: MET  2.  Ind with transversus abdominus activation on exhale Baseline:  Goal status: MET  3.  Pt will report 25% less pain Baseline: 3/10 down from 5/10 initially Goal status: MET    LONG TERM GOALS: Target date: 01/25/23 Updated 12/22/22  Pt will report 80% reduction of pain due to improvements in posture, strength, and muscle length  Baseline: 40-50% better, had a couple weeks without it Goal status: NOT MET  2.  Pt will be independent with advanced HEP to maintain improvements made throughout therapy  Baseline:  Goal status: MET  3.  Pt will have 0-1/3 score of Marinoff scale  Baseline: 1/3 Goal status: MET  4.  Pt will be able to correctly activate their core and lift 20 lb x 5 reps Baseline: lifting correctly did not attempt 20 lb yet Goal status: paritally met  5.  Pt will be able to sit on the ground without increased pain Baseline:  Goal status: partially met 6.  Pt will be able to do running/jumping related activities for at least 10 minutes without feeling leakage or urgency Baseline: every 3-4 days will have a very small amount, haven't done any running Goal status: partially met    PLAN:  PT FREQUENCY: 1x/week  PT DURATION: 12 weeks  PLANNED INTERVENTIONS: Therapeutic  exercises, Therapeutic activity, Neuromuscular re-education, Balance training, Gait training, Patient/Family education, Self Care, Joint mobilization, Aquatic Therapy, Dry Needling, Electrical stimulation, Cryotherapy, Moist heat, Taping, Biofeedback, Manual therapy, and Re-evaluation  PLAN FOR NEXT SESSION: d/c today   Junious Silk, PT 12/22/2022, 5:44 PM  PHYSICAL THERAPY DISCHARGE SUMMARY  Visits from Start of Care: 17  Current functional level related to goals / functional outcomes: See above goals   Remaining deficits: See above   Education / Equipment: HEP   Patient agrees to discharge. Patient goals were partially met. Patient is being discharged due to being pleased with the current functional level.  Russella Dar, PT, DPT 12/22/22 6:02 PM

## 2022-12-22 ENCOUNTER — Encounter: Payer: Self-pay | Admitting: Physical Therapy

## 2022-12-22 ENCOUNTER — Ambulatory Visit: Payer: Medicaid Other | Attending: Obstetrics and Gynecology | Admitting: Physical Therapy

## 2022-12-22 DIAGNOSIS — M6281 Muscle weakness (generalized): Secondary | ICD-10-CM | POA: Insufficient documentation

## 2022-12-22 DIAGNOSIS — M62838 Other muscle spasm: Secondary | ICD-10-CM | POA: Diagnosis present

## 2022-12-22 DIAGNOSIS — M5459 Other low back pain: Secondary | ICD-10-CM | POA: Diagnosis present

## 2022-12-22 DIAGNOSIS — R293 Abnormal posture: Secondary | ICD-10-CM | POA: Insufficient documentation

## 2023-01-12 ENCOUNTER — Encounter (HOSPITAL_BASED_OUTPATIENT_CLINIC_OR_DEPARTMENT_OTHER): Payer: Self-pay | Admitting: Obstetrics and Gynecology

## 2023-01-12 ENCOUNTER — Encounter (HOSPITAL_BASED_OUTPATIENT_CLINIC_OR_DEPARTMENT_OTHER)
Admission: RE | Disposition: A | Discharge status: Home / Self Care | Payer: Self-pay | Source: Home / Self Care | Attending: Obstetrics and Gynecology

## 2023-01-12 ENCOUNTER — Ambulatory Visit (HOSPITAL_BASED_OUTPATIENT_CLINIC_OR_DEPARTMENT_OTHER): Payer: Medicaid Other | Admitting: Certified Registered Nurse Anesthetist

## 2023-01-12 ENCOUNTER — Ambulatory Visit (HOSPITAL_BASED_OUTPATIENT_CLINIC_OR_DEPARTMENT_OTHER)
Admission: RE | Admit: 2023-01-12 | Discharge: 2023-01-12 | Disposition: A | Discharge status: Home / Self Care | Payer: Medicaid Other | Attending: Obstetrics and Gynecology | Admitting: Obstetrics and Gynecology

## 2023-01-12 DIAGNOSIS — R9389 Abnormal findings on diagnostic imaging of other specified body structures: Secondary | ICD-10-CM | POA: Insufficient documentation

## 2023-01-12 DIAGNOSIS — D259 Leiomyoma of uterus, unspecified: Secondary | ICD-10-CM | POA: Insufficient documentation

## 2023-01-12 DIAGNOSIS — Z01818 Encounter for other preprocedural examination: Secondary | ICD-10-CM

## 2023-01-12 DIAGNOSIS — N939 Abnormal uterine and vaginal bleeding, unspecified: Secondary | ICD-10-CM | POA: Diagnosis not present

## 2023-01-12 HISTORY — PX: DILATATION & CURETTAGE/HYSTEROSCOPY WITH MYOSURE: SHX6511

## 2023-01-12 LAB — CBC
HCT: 42.1 % (ref 36.0–46.0)
Hemoglobin: 13.1 g/dL (ref 12.0–15.0)
MCH: 25.1 pg — ABNORMAL LOW (ref 26.0–34.0)
MCHC: 31.1 g/dL (ref 30.0–36.0)
MCV: 80.8 fL (ref 80.0–100.0)
Platelets: 228 10*3/uL (ref 150–400)
RBC: 5.21 MIL/uL — ABNORMAL HIGH (ref 3.87–5.11)
RDW: 13.4 % (ref 11.5–15.5)
WBC: 7.7 10*3/uL (ref 4.0–10.5)
nRBC: 0 % (ref 0.0–0.2)

## 2023-01-12 LAB — POCT PREGNANCY, URINE: Preg Test, Ur: NEGATIVE

## 2023-01-12 LAB — TYPE AND SCREEN
ABO/RH(D): O POS
Antibody Screen: POSITIVE

## 2023-01-12 SURGERY — DILATATION & CURETTAGE/HYSTEROSCOPY WITH MYOSURE
Anesthesia: General | Site: Uterus

## 2023-01-12 MED ORDER — KETOROLAC TROMETHAMINE 30 MG/ML IJ SOLN
INTRAMUSCULAR | Status: DC | PRN
  Administered 2023-01-12: 30 mg via INTRAVENOUS

## 2023-01-12 MED ORDER — ONDANSETRON HCL 4 MG/2ML IJ SOLN
INTRAMUSCULAR | Status: AC
  Filled 2023-01-12: qty 2

## 2023-01-12 MED ORDER — MIDAZOLAM HCL 5 MG/5ML IJ SOLN
INTRAMUSCULAR | Status: DC | PRN
  Administered 2023-01-12: 2 mg via INTRAVENOUS

## 2023-01-12 MED ORDER — DEXMEDETOMIDINE HCL IN NACL 80 MCG/20ML IV SOLN
INTRAVENOUS | Status: AC
  Filled 2023-01-12: qty 40

## 2023-01-12 MED ORDER — LIDOCAINE HCL (PF) 2 % IJ SOLN
INTRAMUSCULAR | Status: AC
  Filled 2023-01-12: qty 10

## 2023-01-12 MED ORDER — AMISULPRIDE (ANTIEMETIC) 5 MG/2ML IV SOLN: 10.0000 mg | Freq: Once | INTRAVENOUS | Status: DC | PRN

## 2023-01-12 MED ORDER — SODIUM CHLORIDE 0.9 % IR SOLN
Status: DC | PRN
  Administered 2023-01-12: 3000 mL

## 2023-01-12 MED ORDER — EPHEDRINE 5 MG/ML INJ
INTRAVENOUS | Status: AC
  Filled 2023-01-12: qty 5

## 2023-01-12 MED ORDER — LIDOCAINE 2% (20 MG/ML) 5 ML SYRINGE
INTRAMUSCULAR | Status: DC | PRN
  Administered 2023-01-12: 60 mg via INTRAVENOUS

## 2023-01-12 MED ORDER — KETAMINE HCL 50 MG/5ML IJ SOSY
PREFILLED_SYRINGE | INTRAMUSCULAR | Status: AC
  Filled 2023-01-12: qty 5

## 2023-01-12 MED ORDER — PROPOFOL 10 MG/ML IV BOLUS
INTRAVENOUS | Status: AC
  Filled 2023-01-12: qty 20

## 2023-01-12 MED ORDER — ROCURONIUM BROMIDE 10 MG/ML (PF) SYRINGE
PREFILLED_SYRINGE | INTRAVENOUS | Status: AC
  Filled 2023-01-12: qty 10

## 2023-01-12 MED ORDER — ROCURONIUM BROMIDE 10 MG/ML (PF) SYRINGE
PREFILLED_SYRINGE | INTRAVENOUS | Status: AC
  Filled 2023-01-12: qty 40

## 2023-01-12 MED ORDER — ONDANSETRON HCL 4 MG/2ML IJ SOLN
INTRAMUSCULAR | Status: DC | PRN
Start: 2023-01-12 — End: 2023-01-12
  Administered 2023-01-12: 4 mg via INTRAVENOUS

## 2023-01-12 MED ORDER — DEXAMETHASONE SODIUM PHOSPHATE 10 MG/ML IJ SOLN
INTRAMUSCULAR | Status: AC
  Filled 2023-01-12: qty 1

## 2023-01-12 MED ORDER — IBUPROFEN 800 MG PO TABS: 800.0000 mg | ORAL_TABLET | Freq: Three times a day (TID) | ORAL | 0 refills | Status: AC | PRN

## 2023-01-12 MED ORDER — MIDAZOLAM HCL 2 MG/2ML IJ SOLN
INTRAMUSCULAR | Status: AC
  Filled 2023-01-12: qty 2

## 2023-01-12 MED ORDER — OXYCODONE HCL 5 MG PO TABS: 5.0000 mg | ORAL_TABLET | Freq: Once | ORAL | Status: DC | PRN

## 2023-01-12 MED ORDER — ACETAMINOPHEN 500 MG PO TABS
1000.0000 mg | ORAL_TABLET | Freq: Four times a day (QID) | ORAL | Status: DC | PRN
  Administered 2023-01-12: 1000 mg via ORAL

## 2023-01-12 MED ORDER — SILVER NITRATE-POT NITRATE 75-25 % EX MISC
CUTANEOUS | Status: DC | PRN
  Administered 2023-01-12: 4

## 2023-01-12 MED ORDER — LACTATED RINGERS IV SOLN: INTRAVENOUS | Status: DC

## 2023-01-12 MED ORDER — PHENYLEPHRINE HCL (PRESSORS) 10 MG/ML IV SOLN
INTRAVENOUS | Status: AC
  Filled 2023-01-12: qty 1

## 2023-01-12 MED ORDER — FENTANYL CITRATE (PF) 100 MCG/2ML IJ SOLN
INTRAMUSCULAR | Status: DC | PRN
  Administered 2023-01-12: 50 ug via INTRAVENOUS

## 2023-01-12 MED ORDER — PHENYLEPHRINE 80 MCG/ML (10ML) SYRINGE FOR IV PUSH (FOR BLOOD PRESSURE SUPPORT)
PREFILLED_SYRINGE | INTRAVENOUS | Status: AC
  Filled 2023-01-12: qty 20

## 2023-01-12 MED ORDER — DOXYCYCLINE MONOHYDRATE 100 MG PO TABS: 100.0000 mg | ORAL_TABLET | Freq: Two times a day (BID) | ORAL | 0 refills | Status: AC

## 2023-01-12 MED ORDER — PROPOFOL 10 MG/ML IV BOLUS
INTRAVENOUS | Status: DC | PRN
  Administered 2023-01-12: 20 mg via INTRAVENOUS
  Administered 2023-01-12: 150 mg via INTRAVENOUS

## 2023-01-12 MED ORDER — ACETAMINOPHEN 500 MG PO TABS
ORAL_TABLET | ORAL | Status: AC
  Filled 2023-01-12: qty 2

## 2023-01-12 MED ORDER — FENTANYL CITRATE (PF) 100 MCG/2ML IJ SOLN
INTRAMUSCULAR | Status: AC
  Filled 2023-01-12: qty 2

## 2023-01-12 MED ORDER — DEXAMETHASONE SODIUM PHOSPHATE 10 MG/ML IJ SOLN
INTRAMUSCULAR | Status: DC | PRN
  Administered 2023-01-12: 10 mg via INTRAVENOUS

## 2023-01-12 MED ORDER — FENTANYL CITRATE (PF) 250 MCG/5ML IJ SOLN
INTRAMUSCULAR | Status: AC
  Filled 2023-01-12: qty 5

## 2023-01-12 MED ORDER — DEXMEDETOMIDINE HCL IN NACL 80 MCG/20ML IV SOLN
INTRAVENOUS | Status: DC | PRN
  Administered 2023-01-12: 8 ug via INTRAVENOUS

## 2023-01-12 MED ORDER — FENTANYL CITRATE (PF) 100 MCG/2ML IJ SOLN: 25.0000 ug | INTRAMUSCULAR | Status: DC | PRN

## 2023-01-12 MED ORDER — OXYCODONE HCL 5 MG/5ML PO SOLN: 5.0000 mg | Freq: Once | ORAL | Status: DC | PRN

## 2023-01-12 SURGICAL SUPPLY — 19 items
CATH ROBINSON RED A/P 16FR (CATHETERS) ×1 IMPLANT
DEVICE MYOSURE LITE (MISCELLANEOUS) IMPLANT
DEVICE MYOSURE REACH (MISCELLANEOUS) IMPLANT
DILATOR CANAL MILEX (MISCELLANEOUS) IMPLANT
DRSG TELFA 3X8 NADH STRL (GAUZE/BANDAGES/DRESSINGS) ×1 IMPLANT
GAUZE 4X4 16PLY ~~LOC~~+RFID DBL (SPONGE) ×1 IMPLANT
GLOVE BIO SURGEON STRL SZ 6.5 (GLOVE) ×1 IMPLANT
GLOVE BIOGEL M 6.5 STRL (GLOVE) ×1 IMPLANT
GLOVE BIOGEL PI IND STRL 7.0 (GLOVE) ×1 IMPLANT
GOWN STRL REUS W/TWL LRG LVL3 (GOWN DISPOSABLE) ×1 IMPLANT
KIT PROCEDURE FLUENT (KITS) ×1 IMPLANT
KIT TURNOVER CYSTO (KITS) ×1 IMPLANT
PACK VAGINAL MINOR WOMEN LF (CUSTOM PROCEDURE TRAY) ×1 IMPLANT
PAD OB MATERNITY 4.3X12.25 (PERSONAL CARE ITEMS) ×1 IMPLANT
SEAL CERVICAL OMNI LOK (ABLATOR) IMPLANT
SEAL ROD LENS SCOPE MYOSURE (ABLATOR) ×1 IMPLANT
SLEEVE SCD COMPRESS KNEE MED (STOCKING) ×1 IMPLANT
TOWEL OR 17X24 6PK STRL BLUE (TOWEL DISPOSABLE) ×2 IMPLANT
UNDERPAD 30X36 HEAVY ABSORB (UNDERPADS AND DIAPERS) ×1 IMPLANT

## 2023-01-12 NOTE — Anesthesia Procedure Notes (Signed)
Procedure Name: LMA Insertion Date/Time: 01/12/2023 10:39 AM  Performed by: Bishop Limbo, CRNAPre-anesthesia Checklist: Patient identified, Emergency Drugs available, Suction available and Patient being monitored Patient Re-evaluated:Patient Re-evaluated prior to induction Oxygen Delivery Method: Circle System Utilized Preoxygenation: Pre-oxygenation with 100% oxygen Induction Type: IV induction Ventilation: Mask ventilation without difficulty LMA: LMA inserted LMA Size: 4.0 Number of attempts: 1 Placement Confirmation: positive ETCO2 Tube secured with: Tape Dental Injury: Teeth and Oropharynx as per pre-operative assessment

## 2023-01-12 NOTE — Anesthesia Postprocedure Evaluation (Signed)
Anesthesia Post Note  Patient: Alycen Mack  Procedure(s) Performed: DILATATION & CURETTAGE/HYSTEROSCOPY WITH MYOSURE (Uterus)     Patient location during evaluation: PACU Anesthesia Type: General Level of consciousness: awake and alert Pain management: pain level controlled Vital Signs Assessment: post-procedure vital signs reviewed and stable Respiratory status: spontaneous breathing, nonlabored ventilation, respiratory function stable and patient connected to nasal cannula oxygen Cardiovascular status: blood pressure returned to baseline and stable Postop Assessment: no apparent nausea or vomiting Anesthetic complications: no  No notable events documented.  Last Vitals:  Vitals:   01/12/23 1202 01/12/23 1225  BP: 118/86 (!) 137/91  Pulse: (!) 50 65  Resp: 13 20  Temp: (!) 36.3 C   SpO2: 96% 99%    Last Pain:  Vitals:   01/12/23 0929  TempSrc: Oral  PainSc: 0-No pain                 Kennieth Rad

## 2023-01-12 NOTE — Discharge Instructions (Signed)

## 2023-01-12 NOTE — Transfer of Care (Signed)
Immediate Anesthesia Transfer of Care Note  Patient: Amber Kelley  Procedure(s) Performed: DILATATION & CURETTAGE/HYSTEROSCOPY WITH MYOSURE (Uterus)  Patient Location: PACU  Anesthesia Type:General  Level of Consciousness: awake and patient cooperative  Airway & Oxygen Therapy: Patient Spontanous Breathing and Patient connected to face mask oxygen  Post-op Assessment: Report given to RN and Post -op Vital signs reviewed and stable  Post vital signs: Reviewed and stable  Last Vitals:  Vitals Value Taken Time  BP 121/95 01/12/23 1117  Temp 36.3 C 01/12/23 1117  Pulse 61 01/12/23 1118  Resp 12 01/12/23 1118  SpO2 100 % 01/12/23 1118  Vitals shown include unfiled device data.  Last Pain:  Vitals:   01/12/23 0929  TempSrc: Oral  PainSc: 0-No pain      Patients Stated Pain Goal: 5 (01/12/23 0929)  Complications: No notable events documented.

## 2023-01-12 NOTE — Interval H&P Note (Signed)
History and Physical Interval Note:  01/12/2023 9:34 AM  Amber Kelley  has presented today for surgery, with the diagnosis of Abnormal Uterine Bleeding  Thickened Endometrium.  The various methods of treatment have been discussed with the patient and family. After consideration of risks, benefits and other options for treatment, the patient has consented to  Procedure(s): DILATATION & CURETTAGE/HYSTEROSCOPY WITH MYOSURE (N/A) as a surgical intervention.  The patient's history has been reviewed, patient examined, no change in status, stable for surgery.  I have reviewed the patient's chart and labs.  Questions were answered to the patient's satisfaction.     [name]

## 2023-01-12 NOTE — Discharge Instr - Supplementary Instructions (Signed)
May take Tylenol after 3:30pm if needed for discomfort.

## 2023-01-12 NOTE — Anesthesia Preprocedure Evaluation (Signed)
Anesthesia Evaluation  Patient identified by MRN, date of birth, ID band Patient awake    Reviewed: Allergy & Precautions, NPO status , Patient's Chart, lab work & pertinent test results  Airway Mallampati: II  TM Distance: >3 FB Neck ROM: Full    Dental  (+) Dental Advisory Given   Pulmonary neg pulmonary ROS   breath sounds clear to auscultation       Cardiovascular hypertension, Pt. on medications  Rhythm:Regular Rate:Normal     Neuro/Psych negative neurological ROS     GI/Hepatic negative GI ROS, Neg liver ROS,,,  Endo/Other  Hypothyroidism    Renal/GU negative Renal ROS     Musculoskeletal   Abdominal   Peds  Hematology negative hematology ROS (+)   Anesthesia Other Findings   Reproductive/Obstetrics                             Anesthesia Physical Anesthesia Plan  ASA: 2  Anesthesia Plan: General   Post-op Pain Management: Tylenol PO (pre-op)* and Toradol IV (intra-op)*   Induction: Intravenous  PONV Risk Score and Plan: 3 and Midazolam, Dexamethasone and Ondansetron  Airway Management Planned: LMA  Additional Equipment: None  Intra-op Plan:   Post-operative Plan: Extubation in OR  Informed Consent: I have reviewed the patients History and Physical, chart, labs and discussed the procedure including the risks, benefits and alternatives for the proposed anesthesia with the patient or authorized representative who has indicated his/her understanding and acceptance.     Dental advisory given  Plan Discussed with: CRNA  Anesthesia Plan Comments:        Anesthesia Quick Evaluation

## 2023-01-12 NOTE — Op Note (Signed)
Pre Op Dx:   1. Abnormal uterine bleeding 2. Thickened endometrium on ultrasound (1.48cm) 3. Fibroid uterus  Post Op Dx:   1. Abnormal uterine bleeding 2. Thickened endometrium on ultrasound (1.48cm) 3. Fibroid uterus  Procedure:   Hysteroscopy with Dilation and Curettage using Myosure   Surgeon:  Dr. [name] Assistants:  None Anesthesia:  LMA   EBL:  5cc  IVF:  700cc UOP:  Voided prior to arrival to OR Fluid Deficit:  170cc   Drains:  None Specimen removed:  Endometrial curettings - sent to pathology Device(s) implanted: None Case Type:  Clean-contaminated Findings:  Normal-appearing cervix and endocervical canal. In the lower uterine segment/upper endocervical canal appeared to have proliferative hyperemic tissue which appeared inflamed. Endometrium in the endometrial cavity appeared normal. Bilateral tubal ostia visualized. Complications: None Indications:  37 y.o. [id number] with AUB and thickened endometrium on ultrasound who desired diagnostic evaluation.  Description of each procedure:  After informed consent was obtained the patient was taken to the operating room in the dorsal supine position.  After administration of general anesthesia, the patient was placed in the dorsal lithotomy position and prepped and draped in the usual sterile fashion. A pre-operative time-out was completed.  The anterior lip of the cervix was grasped with a single-tooth tenaculum and the cervix was serially dilated to accommodate the hysteroscope.  The hysteroscope was advanced and the findings as above was noted. The Myosure Reach was used to globally sample the endometrium and resect the abovementioned hyperemic/inflamed tissue. A banjo curette was used to curettage the endometrium. The single-tooth tenaculum was removed and its sites were made hemostatic with silver nitrate and pressure.  Adequate hemostasis was noted.  The patient was awakened and extubated and appeared to have tolerated the  procedure well.  All counts were correct.  Disposition:  PACU  [name], DO

## 2023-01-13 ENCOUNTER — Encounter (HOSPITAL_BASED_OUTPATIENT_CLINIC_OR_DEPARTMENT_OTHER): Payer: Self-pay | Admitting: Obstetrics and Gynecology

## 2023-01-13 LAB — SURGICAL PATHOLOGY

## 2023-01-24 ENCOUNTER — Other Ambulatory Visit: Payer: Self-pay

## 2023-02-25 ENCOUNTER — Ambulatory Visit
Admission: EM | Admit: 2023-02-25 | Discharge: 2023-02-25 | Disposition: A | Discharge status: Home / Self Care | Payer: Medicaid Other | Attending: Urgent Care | Admitting: Urgent Care

## 2023-02-25 DIAGNOSIS — R0981 Nasal congestion: Secondary | ICD-10-CM

## 2023-02-25 DIAGNOSIS — J029 Acute pharyngitis, unspecified: Secondary | ICD-10-CM

## 2023-02-25 DIAGNOSIS — U071 COVID-19: Secondary | ICD-10-CM | POA: Insufficient documentation

## 2023-02-25 LAB — POCT RAPID STREP A (OFFICE): Rapid Strep A Screen: NEGATIVE

## 2023-02-25 MED ORDER — IPRATROPIUM BROMIDE 0.03 % NA SOLN: 2.0000 | Freq: Two times a day (BID) | NASAL | 12 refills | Status: AC

## 2023-02-25 NOTE — Discharge Instructions (Signed)
Your sore throat is likely related to a virus. Your strep test is negative.  Your covid test is pending. I have called in medication to help with your congestion. Please AVOID over the counter medications containing sudafed (or phenylephrine or anything that says "decongestant") as your blood pressure is elevated.  Please alternate tylenol with ibuprofen to help with the discomfort. You may also try chloraseptic spray or cepacol lozenges. Monitor for fever >100.3, swollen lymph nodes or white spots on the back of the throat which may indicate a need to return to our clinic.

## 2023-02-25 NOTE — ED Triage Notes (Signed)
Pt presents with c/o itchy and scratchy throat. States she has been feeling congested x 2 days.

## 2023-02-25 NOTE — ED Provider Notes (Signed)
[facility]    CSN: [id number] Arrival date & time: 02/25/23  1312      History   Chief Complaint Chief Complaint  Patient presents with  . Sore Throat    HPI Amber Kelley is a 37 y.o. female.    Sore Throat   Past Medical History:  Diagnosis Date  . Anxiety   . Fibroid   . Hypothyroidism   . Keratoconus of both eyes   . Preeclampsia   . Pregnancy induced hypertension     Patient Active Problem List   Diagnosis Date Noted  . Abnormal uterine bleeding (AUB) 01/12/2023  . Thickened endometrium 01/12/2023  . History of myomectomy 05/20/2022  . Fibroid uterus 05/02/2020  . Hypothyroidism due to Hashimoto's thyroiditis 10/13/2017  . Irritable bowel syndrome with diarrhea 10/13/2017  . Allergic conjunctivitis of both eyes 07/26/2017  . Myopia of both eyes with astigmatism 07/26/2017  . Unstable keratoconus of both eyes 07/26/2017    Past Surgical History:  Procedure Laterality Date  . CESAREAN SECTION N/A 05/21/2022   Procedure: CESAREAN SECTION;  Surgeon: [name], DO;  Location: [facility];  Service: Obstetrics;  Laterality: N/A;  . DILATATION & CURETTAGE/HYSTEROSCOPY WITH MYOSURE N/A 01/12/2023   Procedure: DILATATION & CURETTAGE/HYSTEROSCOPY WITH MYOSURE;  Surgeon: [name], DO;  Location: [facility] [place];  Service: Gynecology;  Laterality: N/A;  . EYE SURGERY Bilateral 2019   per patient "CORNEAL CROSS-LINKING"  . MYOMECTOMY N/A 05/07/2020   Procedure: ABDOMINAL MYOMECTOMY;  Surgeon: [name], DO;  Location: [facility] OR;  Service: Gynecology;  Laterality: N/A;  . THYROIDECTOMY, PARTIAL  2012    OB History     Gravida  2   Para  1   Term  0   Preterm  1   AB  1   Living  1      SAB  1   IAB      Ectopic      Multiple  0   Live Births  1            Home Medications    Prior to Admission medications   Medication Sig Start Date End Date Taking? Authorizing Provider  cetirizine (ZYRTEC) 10 MG  chewable tablet Chew 10 mg by mouth daily.    [provider]  fluticasone (FLONASE) 50 MCG/ACT nasal spray Place into both nostrils. 05/05/22   [provider]  ibuprofen (ADVIL) 800 MG tablet Take 1 tablet (800 mg total) by mouth every 8 (eight) hours as needed. 01/12/23   [name], DO  levothyroxine (SYNTHROID) 88 MCG tablet Take 88 mcg by mouth daily before breakfast.  10/27/17   [provider]  Prenatal Vit-Fe Fumarate-FA (MULTIVITAMIN-PRENATAL) 27-0.8 MG TABS tablet Take 1 tablet by mouth daily at 12 noon. Patient not taking: Reported on 08/12/2022    [provider]  pseudoephedrine (SUDAFED) 30 MG tablet Take 30 mg by mouth every 4 (four) hours as needed for congestion.    [provider]  sertraline (ZOLOFT) 25 MG tablet Take 50 mg by mouth daily. 04/08/22   [provider]    Family History Family History  Family history unknown: Yes    Social History Social History   Tobacco Use  . Smoking status: Never  . Smokeless tobacco: Never  Vaping Use  . Vaping status: Never Used  Substance Use Topics  . Alcohol use: Not Currently    Comment: rarely  . Drug use: Never  Allergies   Banana   Review of Systems Review of Systems   Physical Exam Triage Vital Signs ED Triage Vitals [02/25/23 1344]  Encounter Vitals Group     BP (!) 152/90     Systolic BP Percentile      Diastolic BP Percentile      Pulse Rate 64     Resp 18     Temp 97.8 F (36.6 C)     Temp Source Oral     SpO2 98 %     Weight      Height      Head Circumference      Peak Flow      Pain Score 3     Pain Loc      Pain Education      Exclude from Growth Chart    No data found.  Updated Vital Signs BP (!) 152/90 (BP Location: Right Arm)   Pulse 64   Temp 97.8 F (36.6 C) (Oral)   Resp 18   LMP  (LMP Unknown)   SpO2 98%   Breastfeeding No   Visual Acuity Right Eye Distance:   Left Eye Distance:   Bilateral Distance:     Right Eye Near:   Left Eye Near:    Bilateral Near:     Physical Exam   UC Treatments / Results  Labs (all labs ordered are listed, but only abnormal results are displayed) Labs Reviewed - No data to display  EKG   Radiology No results found.  Procedures Procedures (including critical care time)  Medications Ordered in UC Medications - No data to display  Initial Impression / Assessment and Plan / UC Course  I have reviewed the triage vital signs and the nursing notes.  Pertinent labs & imaging results that were available during my care of the patient were reviewed by me and considered in my medical decision making (see chart for details).     *** Final Clinical Impressions(s) / UC Diagnoses   Final diagnoses:  None   Discharge Instructions   None    ED Prescriptions   None    PDMP not reviewed this encounter.

## 2023-02-26 LAB — SARS CORONAVIRUS 2 (TAT 6-24 HRS): SARS Coronavirus 2: POSITIVE — AB

## 2023-07-30 IMAGING — DX DG CHEST 1V PORT
1 series · 1 of 1 positions shown · non-contrast
Comparison: None.

CLINICAL DATA: Shortness of breath

EXAM:
PORTABLE CHEST 1 VIEW

[chest ap]
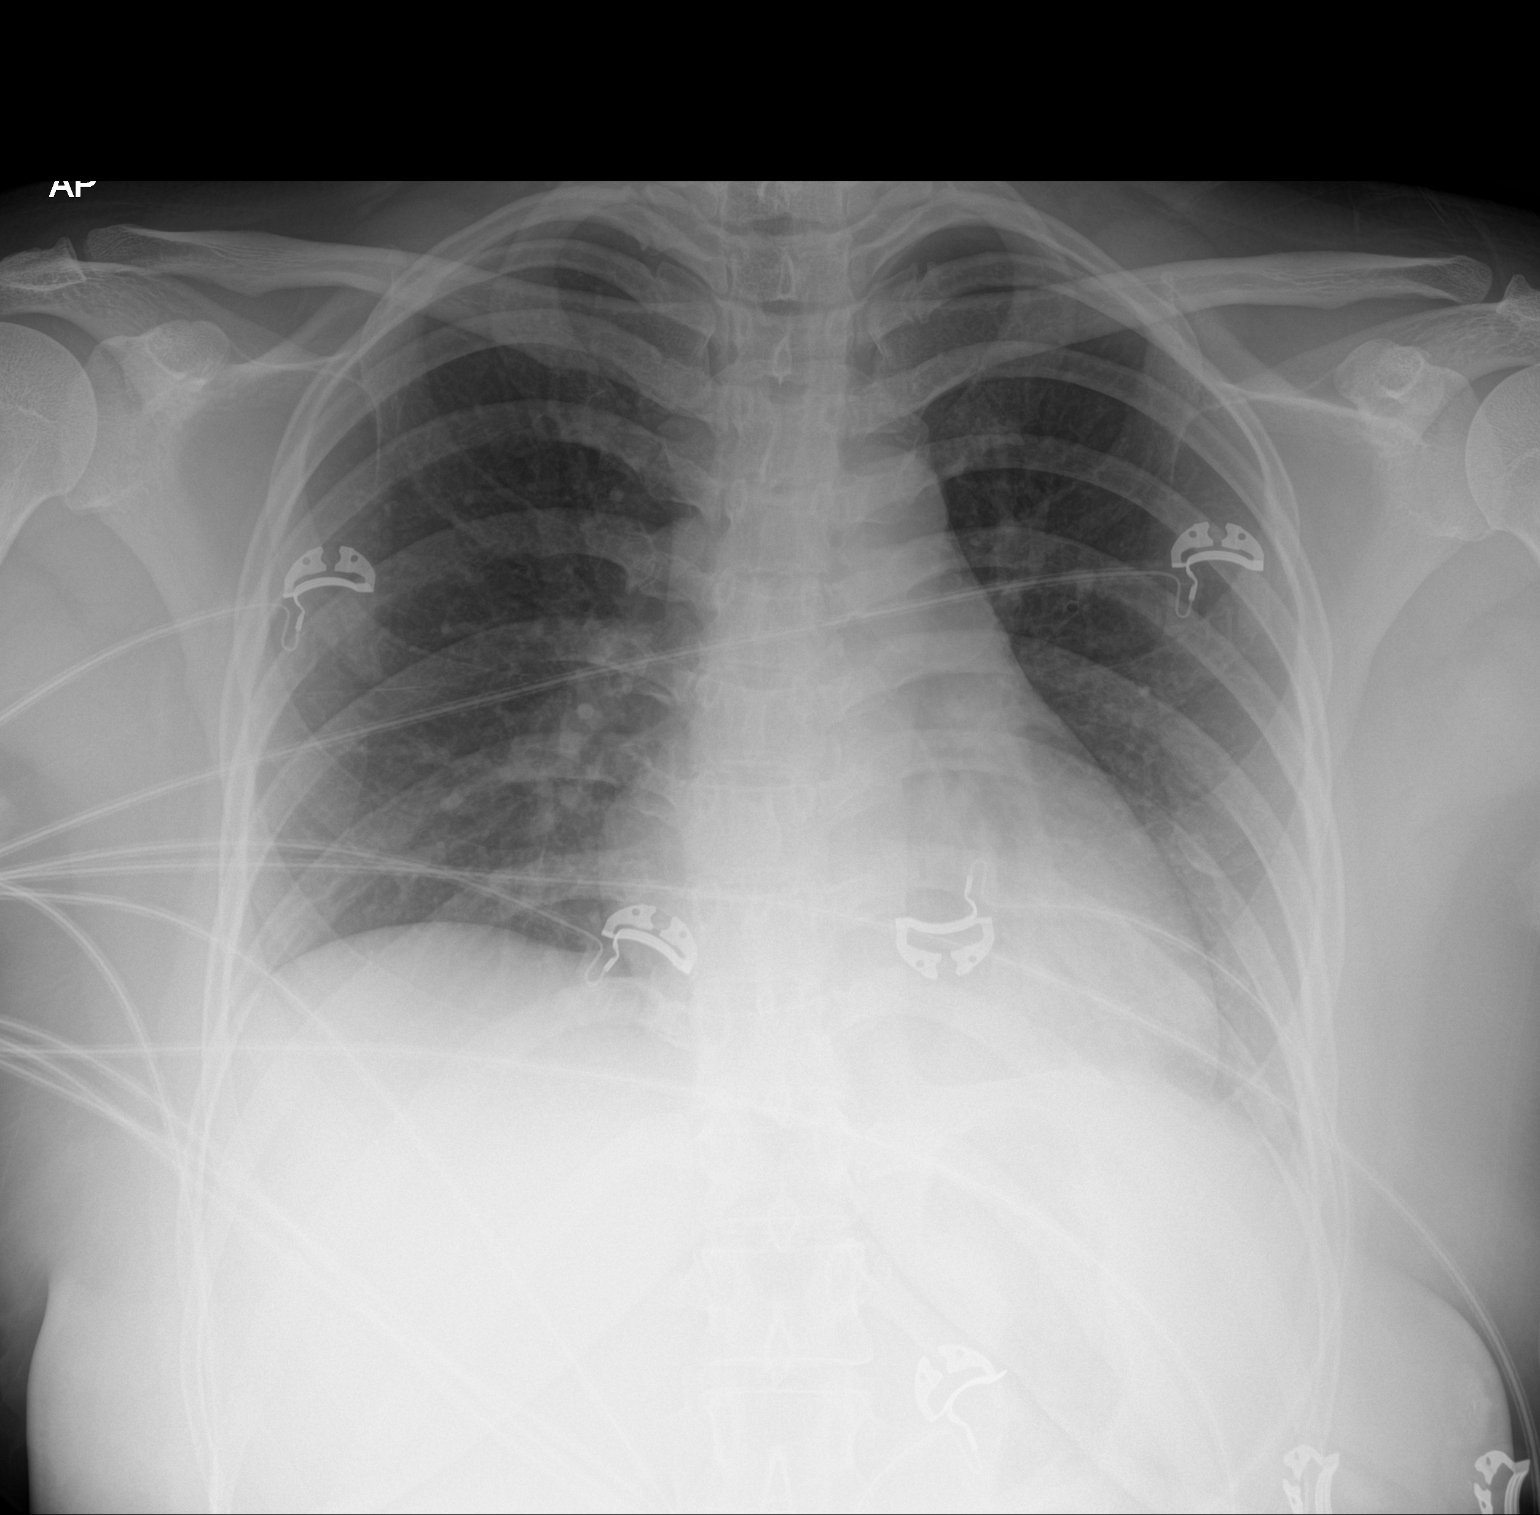

[1 of 1 positions shown; findings below may reference images not displayed]

FINDINGS: Heart size and mediastinal contours are within normal limits. No
suspicious pulmonary opacities identified.

No pleural effusion or pneumothorax visualized.

No acute osseous abnormality appreciated.
IMPRESSION: No acute intrathoracic process identified.

## 2023-08-07 DIAGNOSIS — R051 Acute cough: Secondary | ICD-10-CM | POA: Diagnosis not present

## 2023-08-07 DIAGNOSIS — J329 Chronic sinusitis, unspecified: Secondary | ICD-10-CM | POA: Diagnosis not present

## 2023-08-07 DIAGNOSIS — Z6826 Body mass index (BMI) 26.0-26.9, adult: Secondary | ICD-10-CM | POA: Diagnosis not present

## 2023-08-24 DIAGNOSIS — Z111 Encounter for screening for respiratory tuberculosis: Secondary | ICD-10-CM | POA: Diagnosis not present

## 2023-08-27 DIAGNOSIS — Z111 Encounter for screening for respiratory tuberculosis: Secondary | ICD-10-CM | POA: Diagnosis not present

## 2023-08-27 DIAGNOSIS — Z6826 Body mass index (BMI) 26.0-26.9, adult: Secondary | ICD-10-CM | POA: Diagnosis not present

## 2024-03-16 DIAGNOSIS — I10 Essential (primary) hypertension: Secondary | ICD-10-CM | POA: Diagnosis not present

## 2024-03-16 DIAGNOSIS — Z23 Encounter for immunization: Secondary | ICD-10-CM | POA: Diagnosis not present

## 2024-03-16 DIAGNOSIS — Z0000 Encounter for general adult medical examination without abnormal findings: Secondary | ICD-10-CM | POA: Diagnosis not present

## 2024-03-16 DIAGNOSIS — E039 Hypothyroidism, unspecified: Secondary | ICD-10-CM | POA: Diagnosis not present

## 2024-03-16 DIAGNOSIS — F411 Generalized anxiety disorder: Secondary | ICD-10-CM | POA: Diagnosis not present

## 2024-04-04 DIAGNOSIS — Z1151 Encounter for screening for human papillomavirus (HPV): Secondary | ICD-10-CM | POA: Diagnosis not present

## 2024-04-04 DIAGNOSIS — D259 Leiomyoma of uterus, unspecified: Secondary | ICD-10-CM | POA: Diagnosis not present

## 2024-04-04 DIAGNOSIS — Z01419 Encounter for gynecological examination (general) (routine) without abnormal findings: Secondary | ICD-10-CM | POA: Diagnosis not present

## 2024-04-04 DIAGNOSIS — Z124 Encounter for screening for malignant neoplasm of cervix: Secondary | ICD-10-CM | POA: Diagnosis not present

## 2024-04-11 DIAGNOSIS — D259 Leiomyoma of uterus, unspecified: Secondary | ICD-10-CM | POA: Diagnosis not present

## 2024-04-25 DIAGNOSIS — E039 Hypothyroidism, unspecified: Secondary | ICD-10-CM | POA: Diagnosis not present

## 2024-05-22 DIAGNOSIS — J019 Acute sinusitis, unspecified: Secondary | ICD-10-CM | POA: Diagnosis not present

## 2024-05-30 DIAGNOSIS — R6884 Jaw pain: Secondary | ICD-10-CM | POA: Diagnosis not present

## 2024-05-30 DIAGNOSIS — F411 Generalized anxiety disorder: Secondary | ICD-10-CM | POA: Diagnosis not present

## 2024-05-30 DIAGNOSIS — E063 Autoimmune thyroiditis: Secondary | ICD-10-CM | POA: Diagnosis not present
